# Patient Record
Sex: Male | Born: 1995 | Race: White | Hispanic: No
Health system: Southern US, Community
[De-identification: ages and names within clinical notes are randomized; demographics above are authoritative.]

---
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
## 2019-11-28 ENCOUNTER — Ambulatory Visit: Payer: 59 | Attending: Internal Medicine

## 2019-11-28 ENCOUNTER — Other Ambulatory Visit: Payer: Self-pay

## 2019-11-28 DIAGNOSIS — Z20822 Contact with and (suspected) exposure to covid-19: Secondary | ICD-10-CM | POA: Insufficient documentation

## 2019-11-29 LAB — NOVEL CORONAVIRUS, NAA: SARS-CoV-2, NAA: NOT DETECTED

## 2019-11-29 LAB — SARS-COV-2, NAA 2 DAY TAT

## 2020-04-17 ENCOUNTER — Other Ambulatory Visit: Payer: Self-pay

## 2020-04-17 DIAGNOSIS — Z20822 Contact with and (suspected) exposure to covid-19: Secondary | ICD-10-CM

## 2020-04-19 LAB — NOVEL CORONAVIRUS, NAA: SARS-CoV-2, NAA: NOT DETECTED

## 2020-04-19 LAB — SPECIMEN STATUS REPORT

## 2020-04-19 LAB — SARS-COV-2, NAA 2 DAY TAT

## 2020-05-02 ENCOUNTER — Ambulatory Visit
Admission: EM | Admit: 2020-05-02 | Discharge: 2020-05-02 | Disposition: A | Discharge status: Home / Self Care | Payer: 59 | Attending: Emergency Medicine | Admitting: Emergency Medicine

## 2020-05-02 ENCOUNTER — Other Ambulatory Visit: Payer: Self-pay

## 2020-05-02 DIAGNOSIS — R197 Diarrhea, unspecified: Secondary | ICD-10-CM | POA: Insufficient documentation

## 2020-05-02 DIAGNOSIS — R112 Nausea with vomiting, unspecified: Secondary | ICD-10-CM | POA: Diagnosis present

## 2020-05-02 MED ORDER — ONDANSETRON HCL 4 MG PO TABS
4.0000 mg | ORAL_TABLET | Freq: Four times a day (QID) | ORAL | 0 refills | Status: DC
Start: 2020-05-02 — End: 2024-06-13

## 2020-05-02 NOTE — ED Triage Notes (Signed)
Pt presents with c/o nausea, vomiting and diarrhea that has been intermittent for past 3 weeks, pt states yesterday was worse, appetite is good but gets full fast

## 2020-05-02 NOTE — ED Provider Notes (Signed)
[facility] CARE CENTER   [id number] 05/02/20 Arrival Time: 1501  CC: N/V/D  SUBJECTIVE:  Stephen Villegas is a 24 y.o. male who presents with complaint of intermittent episodes of nausea, vomiting, and diarrhea x 3 weeks.  Does not occur daily.  Most recent episode began last night.  Reports vomiting every hour since last night and have 3-4 watery BMs.  Denies a precipitating event, trauma, close contacts with similar symptoms, recent travel or antibiotic use.  Denies changes in diet or eating anything unusual.  Denies abdominal pain.  Has tried OTC pepto without relief.  Denies aggravating factors.  Denies similar symptoms in the past.  Reports chills, and decreased appetite.    Denies fever, weight changes, nausea, vomiting, chest pain, SOB, diarrhea, constipation, hematochezia, melena, dysuria, difficulty urinating, increased frequency or urgency, flank pain, loss of bowel or bladder function.  No LMP for male patient.  ROS: As per HPI.  All other pertinent ROS negative.     History reviewed. No pertinent past medical history. History reviewed. No pertinent surgical history. No Known Allergies No current facility-administered medications on file prior to encounter.   No current outpatient medications on file prior to encounter.   Social History   Socioeconomic History  . Marital status: Single    Spouse name: Not on file  . Number of children: Not on file  . Years of education: Not on file  . Highest education level: Not on file  Occupational History  . Not on file  Tobacco Use  . Smoking status: Never Smoker  . Smokeless tobacco: Never Used  Vaping Use  . Vaping Use: Every day  Substance and Sexual Activity  . Alcohol use: Not Currently  . Drug use: Yes    Types: Marijuana  . Sexual activity: Not on file  Other Topics Concern  . Not on file  Social History Narrative  . Not on file   Social Determinants of Health   Financial Resource Strain:   . Difficulty of Paying  Living Expenses: Not on file  Food Insecurity:   . Worried About Programme researcher, broadcasting/film/video in the Last Year: Not on file  . Ran Out of Food in the Last Year: Not on file  Transportation Needs:   . Lack of Transportation (Medical): Not on file  . Lack of Transportation (Non-Medical): Not on file  Physical Activity:   . Days of Exercise per Week: Not on file  . Minutes of Exercise per Session: Not on file  Stress:   . Feeling of Stress : Not on file  Social Connections:   . Frequency of Communication with Friends and Family: Not on file  . Frequency of Social Gatherings with Friends and Family: Not on file  . Attends Religious Services: Not on file  . Active Member of Clubs or Organizations: Not on file  . Attends Banker Meetings: Not on file  . Marital Status: Not on file  Intimate Partner Violence:   . Fear of Current or Ex-Partner: Not on file  . Emotionally Abused: Not on file  . Physically Abused: Not on file  . Sexually Abused: Not on file   History reviewed. No pertinent family history.   OBJECTIVE:  Vitals:   05/02/20 1526  BP: 126/71  Pulse: 79  Resp: 20  Temp: 98.4 F (36.9 C)  SpO2: 97%    General appearance: Alert; NAD HEENT: NCAT.  Oropharynx clear.  Lungs: clear to auscultation bilaterally without adventitious breath sounds Heart: regular  rate and rhythm.   Abdomen: soft, non-distended; normal active bowel sounds; non-tender to light and deep palpation; nontender at McBurney's point; no guarding Extremities: no edema; symmetrical with no gross deformities Skin: warm and dry Neurologic: normal gait Psychological: alert and cooperative; normal mood and affect   ASSESSMENT & PLAN:  1. Nausea vomiting and diarrhea     Meds ordered this encounter  Medications  . ondansetron (ZOFRAN) 4 MG tablet    Sig: Take 1 tablet (4 mg total) by mouth every 6 (six) hours.    Dispense:  12 tablet    Refill:  0    Order Specific Question:   Supervising  Provider    Answer:   Raylene Everts [[id number]]    Get rest and push fluids Avoid foods that do not agree with you Zofran prescribed for nausea Stool study kit given.  Please refrigerate at home and return ASAP Begin keeping a food diary to track what foods may be contributing to your symptoms Follow up with PCP or GI for further evaluation and management  If you experience new or worsening symptoms return or go to ER such as fever, chills, nausea, vomiting, diarrhea, bloody or dark tarry stools, constipation, urinary symptoms, abdominal discomfort, symptoms that do not improve with medications, inability to keep fluids down, etc...  Reviewed expectations re: course of current medical issues. Questions answered. Outlined signs and symptoms indicating need for more acute intervention. Patient verbalized understanding. After Visit Summary given.   Lestine Box, PA-C 05/02/20 1558

## 2020-05-02 NOTE — Discharge Instructions (Addendum)
Get rest and push fluids Avoid foods that do not agree with you Zofran prescribed for nausea Stool study kit given.  Please refrigerate at home and return ASAP Begin keeping a food diary to track what foods may be contributing to your symptoms Follow up with PCP or GI for further evaluation and management  If you experience new or worsening symptoms return or go to ER such as fever, chills, nausea, vomiting, diarrhea, bloody or dark tarry stools, constipation, urinary symptoms, abdominal discomfort, symptoms that do not improve with medications, inability to keep fluids down, etc...  Information on FODMAP diet attached

## 2020-05-05 ENCOUNTER — Ambulatory Visit
Admission: EM | Admit: 2020-05-05 | Discharge: 2020-05-05 | Disposition: A | Discharge status: Home / Self Care | Payer: 59 | Attending: Emergency Medicine | Admitting: Emergency Medicine

## 2020-05-05 DIAGNOSIS — Z1152 Encounter for screening for COVID-19: Secondary | ICD-10-CM | POA: Diagnosis not present

## 2020-05-05 DIAGNOSIS — R112 Nausea with vomiting, unspecified: Secondary | ICD-10-CM

## 2020-05-05 DIAGNOSIS — A084 Viral intestinal infection, unspecified: Secondary | ICD-10-CM

## 2020-05-05 LAB — GASTROINTESTINAL PANEL BY PCR, STOOL (REPLACES STOOL CULTURE)

## 2020-05-05 MED ORDER — ONDANSETRON 4 MG PO TBDP: 4.0000 mg | ORAL_TABLET | Freq: Three times a day (TID) | ORAL | 0 refills | Status: DC | PRN

## 2020-05-05 NOTE — Discharge Instructions (Addendum)

## 2020-05-05 NOTE — ED Provider Notes (Signed)
[facility] CARE CENTER   [id number] 05/05/20 Arrival Time: 1256  CC: ABDOMINAL DISCOMFORT  SUBJECTIVE:  Stephen Villegas is a 24 y.o. male who presented to the urgent care with a complaint o nausea, vomiting and diarrhea for the past few days.  Was seen at the urgent care on 05/02/2020.  Zofran was prescribed.  Gastrointestinal panel was ordered and we are still awaiting results.  Denies a precipitating event, trauma, close contacts with similar symptoms, recent travel or antibiotic use.  Denies abdominal pain.  Denies alleviating or aggravating factors.  Denies similar symptoms in the past.  Denies chills, fever, nausea, vomiting, diarrhea, constipation, hematochezia, melena, dysuria, difficulty urinating, increased frequency or urgency, flank pain, loss of bowel or bladder function.   No LMP for male patient.  ROS: As per HPI.  All other pertinent ROS negative.     History reviewed. No pertinent past medical history. History reviewed. No pertinent surgical history. No Known Allergies No current facility-administered medications on file prior to encounter.   Current Outpatient Medications on File Prior to Encounter  Medication Sig Dispense Refill  . ondansetron (ZOFRAN) 4 MG tablet Take 1 tablet (4 mg total) by mouth every 6 (six) hours. 12 tablet 0   Social History   Socioeconomic History  . Marital status: Single    Spouse name: Not on file  . Number of children: Not on file  . Years of education: Not on file  . Highest education level: Not on file  Occupational History  . Not on file  Tobacco Use  . Smoking status: Never Smoker  . Smokeless tobacco: Never Used  Vaping Use  . Vaping Use: Every day  Substance and Sexual Activity  . Alcohol use: Not Currently  . Drug use: Yes    Types: Marijuana  . Sexual activity: Not on file  Other Topics Concern  . Not on file  Social History Narrative  . Not on file   Social Determinants of Health   Financial Resource Strain:   .  Difficulty of Paying Living Expenses: Not on file  Food Insecurity:   . Worried About Programme researcher, broadcasting/film/video in the Last Year: Not on file  . Ran Out of Food in the Last Year: Not on file  Transportation Needs:   . Lack of Transportation (Medical): Not on file  . Lack of Transportation (Non-Medical): Not on file  Physical Activity:   . Days of Exercise per Week: Not on file  . Minutes of Exercise per Session: Not on file  Stress:   . Feeling of Stress : Not on file  Social Connections:   . Frequency of Communication with Friends and Family: Not on file  . Frequency of Social Gatherings with Friends and Family: Not on file  . Attends Religious Services: Not on file  . Active Member of Clubs or Organizations: Not on file  . Attends Banker Meetings: Not on file  . Marital Status: Not on file  Intimate Partner Violence:   . Fear of Current or Ex-Partner: Not on file  . Emotionally Abused: Not on file  . Physically Abused: Not on file  . Sexually Abused: Not on file   Family History  Problem Relation Age of Onset  . Healthy Mother   . Healthy Father      OBJECTIVE:  Vitals:   05/05/20 1304  BP: 129/81  Pulse: 68  Resp: 19  Temp: 98.4 F (36.9 C)  SpO2: 96%    General appearance:  Alert; NAD HEENT: NCAT.  Oropharynx clear.  Lungs: clear to auscultation bilaterally without adventitious breath sounds Heart: regular rate and rhythm.  Radial pulses 2+ symmetrical bilaterally Abdomen: soft, non-distended; normal active bowel sounds; non-tender to light and deep palpation; nontender at McBurney's point; negative Murphy's sign; negative rebound; no guarding Back: no CVA tenderness Extremities: no edema; symmetrical with no gross deformities Skin: warm and dry Neurologic: normal gait Psychological: alert and cooperative; normal mood and affect  LABS: No results found for this or any previous visit (from the past 24 hour(s)).  DIAGNOSTIC STUDIES: No results found.    ASSESSMENT & PLAN:  1. Non-intractable vomiting with nausea, unspecified vomiting type   2. Viral gastroenteritis   3. Encounter for screening for COVID-19     Meds ordered this encounter  Medications  . ondansetron (ZOFRAN ODT) 4 MG disintegrating tablet    Sig: Take 1 tablet (4 mg total) by mouth every 8 (eight) hours as needed for nausea or vomiting.    Dispense:  30 tablet    Refill:  0   Get rest and drink fluids Zofran prescribed.  Take as directed.    DIET Instructions:  30 minutes after taking nausea medicine, begin with sips of clear liquids. If able to hold down 2 - 4 ounces for 30 minutes, begin drinking more. Increase your fluid intake to replace losses. Clear liquids only for 24 hours (water, tea, sport drinks, clear flat ginger ale or cola and juices, broth, jello, popsicles, ect). Advance to bland foods, applesauce, rice, baked or boiled chicken, ect. Avoid milk, greasy foods and anything that doesn't agree with you.  If you experience new or worsening symptoms return or go to ER such as fever, chills, nausea, vomiting, diarrhea, bloody or dark tarry stools, constipation, urinary symptoms, worsening abdominal discomfort, symptoms that do not improve with medications, inability to keep fluids down, etc...    Reviewed expectations re: course of current medical issues. Questions answered. Outlined signs and symptoms indicating need for more acute intervention. Patient verbalized understanding. After Visit Summary given.   Durward Parcel, FNP 05/05/20 1319

## 2020-05-05 NOTE — ED Triage Notes (Signed)
Pt presents with complaints of ongoing nausea. Reports he was here on Friday and has not had any relief. Reports he has not been able to eat and drink like normal due to the feeling of wanting to throw up. Reports he has thrown up a couple of times but it was a small amount.

## 2020-05-07 LAB — NOVEL CORONAVIRUS, NAA: SARS-CoV-2, NAA: NOT DETECTED

## 2020-05-07 LAB — SARS-COV-2, NAA 2 DAY TAT

## 2020-11-11 ENCOUNTER — Other Ambulatory Visit: Payer: Self-pay

## 2020-11-11 ENCOUNTER — Other Ambulatory Visit (HOSPITAL_COMMUNITY): Payer: Self-pay | Admitting: Family Medicine

## 2020-11-11 ENCOUNTER — Ambulatory Visit (HOSPITAL_COMMUNITY)
Admission: RE | Admit: 2020-11-11 | Discharge: 2020-11-11 | Disposition: A | Discharge status: Home / Self Care | Payer: 59 | Source: Ambulatory Visit | Attending: Family Medicine | Admitting: Family Medicine

## 2020-11-11 DIAGNOSIS — R0782 Intercostal pain: Secondary | ICD-10-CM | POA: Diagnosis not present

## 2020-12-26 ENCOUNTER — Other Ambulatory Visit: Payer: Self-pay

## 2020-12-26 ENCOUNTER — Encounter (HOSPITAL_COMMUNITY): Payer: Self-pay | Admitting: *Deleted

## 2020-12-26 ENCOUNTER — Emergency Department (HOSPITAL_COMMUNITY): Payer: 59

## 2020-12-26 ENCOUNTER — Emergency Department (HOSPITAL_COMMUNITY)
Admission: EM | Admit: 2020-12-26 | Discharge: 2020-12-26 | Disposition: A | Discharge status: Home / Self Care | Payer: 59 | Attending: Emergency Medicine | Admitting: Emergency Medicine

## 2020-12-26 DIAGNOSIS — R110 Nausea: Secondary | ICD-10-CM | POA: Diagnosis not present

## 2020-12-26 DIAGNOSIS — K5900 Constipation, unspecified: Secondary | ICD-10-CM | POA: Diagnosis not present

## 2020-12-26 DIAGNOSIS — R1084 Generalized abdominal pain: Secondary | ICD-10-CM | POA: Insufficient documentation

## 2020-12-26 LAB — URINALYSIS, ROUTINE W REFLEX MICROSCOPIC
Bacteria, UA: NONE SEEN
Bilirubin Urine: NEGATIVE
Glucose, UA: 50 mg/dL — AB
Ketones, ur: 80 mg/dL — AB
Leukocytes,Ua: NEGATIVE
Nitrite: NEGATIVE
Protein, ur: 100 mg/dL — AB
Specific Gravity, Urine: 1.029 (ref 1.005–1.030)
pH: 6 (ref 5.0–8.0)

## 2020-12-26 LAB — COMPREHENSIVE METABOLIC PANEL
ALT: 19 U/L (ref 0–44)
AST: 22 U/L (ref 15–41)
Albumin: 5 g/dL (ref 3.5–5.0)
Alkaline Phosphatase: 60 U/L (ref 38–126)
Anion gap: 9 (ref 5–15)
BUN: 15 mg/dL (ref 6–20)
CO2: 30 mmol/L (ref 22–32)
Calcium: 9.5 mg/dL (ref 8.9–10.3)
Chloride: 98 mmol/L (ref 98–111)
Creatinine, Ser: 0.86 mg/dL (ref 0.61–1.24)
GFR, Estimated: >60 mL/min (ref >60)
Glucose, Bld: 126 mg/dL — ABNORMAL HIGH (ref 70–99)
Potassium: 3.8 mmol/L (ref 3.5–5.1)
Sodium: 137 mmol/L (ref 135–145)
Total Bilirubin: 1.1 mg/dL (ref 0.3–1.2)
Total Protein: 8.3 g/dL — ABNORMAL HIGH (ref 6.5–8.1)

## 2020-12-26 LAB — CBC WITH DIFFERENTIAL/PLATELET
Abs Immature Granulocytes: 0.05 10*3/uL (ref 0.00–0.07)
Basophils Absolute: 0.1 10*3/uL (ref 0.0–0.1)
Basophils Relative: 1 %
Eosinophils Absolute: 0 10*3/uL (ref 0.0–0.5)
Eosinophils Relative: 0 %
HCT: 49.7 % (ref 39.0–52.0)
Hemoglobin: 16.4 g/dL (ref 13.0–17.0)
Immature Granulocytes: 1 %
Lymphocytes Relative: 6 %
Lymphs Abs: 0.7 10*3/uL (ref 0.7–4.0)
MCH: 30.8 pg (ref 26.0–34.0)
MCHC: 33 g/dL (ref 30.0–36.0)
MCV: 93.4 fL (ref 80.0–100.0)
Monocytes Absolute: 0.3 10*3/uL (ref 0.1–1.0)
Monocytes Relative: 3 %
Neutro Abs: 9.7 10*3/uL — ABNORMAL HIGH (ref 1.7–7.7)
Neutrophils Relative %: 89 %
Platelets: 337 10*3/uL (ref 150–400)
RBC: 5.32 MIL/uL (ref 4.22–5.81)
RDW: 12.1 % (ref 11.5–15.5)
WBC: 10.8 10*3/uL — ABNORMAL HIGH (ref 4.0–10.5)
nRBC: 0 % (ref 0.0–0.2)

## 2020-12-26 LAB — LIPASE, BLOOD: Lipase: 24 U/L (ref 11–51)

## 2020-12-26 MED ORDER — OMEPRAZOLE 20 MG PO CPDR: 20.0000 mg | DELAYED_RELEASE_CAPSULE | Freq: Every day | ORAL | 0 refills | Status: DC

## 2020-12-26 MED ORDER — ONDANSETRON HCL 4 MG/2ML IJ SOLN
4.0000 mg | Freq: Once | INTRAMUSCULAR | Status: AC
  Administered 2020-12-26: 4 mg via INTRAVENOUS
  Filled 2020-12-26: qty 2

## 2020-12-26 NOTE — ED Triage Notes (Signed)
C/o abdominal pain with nausea and vomiting for months. States symptoms got worse today

## 2020-12-26 NOTE — Discharge Instructions (Signed)
Please pick up medication and take as prescribed. I have placed a referral to GI for you. They will call you to schedule an appointment. I would also recommend cutting back on marijuana as this may be causing your symptoms as well.   Drink plenty of fluids to stay hydrated.   Follow up with your PCP regarding ED visit.   Return for any new/worsening symptoms

## 2020-12-26 NOTE — ED Provider Notes (Signed)
Emergency Medicine Provider Triage Evaluation Note  Stephen Villegas , a 25 y.o. male  was evaluated in triage.  Pt complains of abdominal pain, n/v, constipation and weight loss over 4-5 months.  Seen at [facility], told was find, seen by pcp and placed on anxiety medicine.   Review of Systems  Positive: N/v/ constipation, abdominal pain, back pain Negative: Fevers, dysuria.    Physical Exam  BP (!) 116/104   Pulse (!) 59   Temp 98.2 F (36.8 C) (Oral)   Resp 18   Ht 5\' 10"  (1.778 m)   Wt 63.5 kg   SpO2 98%   BMI 20.09 kg/m  Gen:   Awake, no distress    Resp:  Normal effort   MSK:   Moves extremities without difficulty   Other:  Generalized abd pain, no guarding. Thin appearing male.  Medical Decision Making  Medically screening exam initiated at 5:59 PM.  Appropriate orders placed.  was informed that the remainder of the evaluation will be completed by another provider, this initial triage assessment does not replace that evaluation, and the importance of remaining in the ED until their evaluation is complete.  Medical screening exam complete.    Stephen Daniel, PA-C 12/26/20 12/28/20    Aldona Lento, MD 12/30/20 1226

## 2020-12-26 NOTE — ED Provider Notes (Signed)
MSE note.  Patient has been having some vomiting and loose stools off and on for a couple months.  Patient is stable and will have labs and x-rays done   Bethann Berkshire, MD 12/26/20 1743

## 2020-12-26 NOTE — ED Provider Notes (Signed)
[facility] EMERGENCY DEPARTMENT Provider Note   CSN: [id number] Arrival date & time: 12/26/20  1634     History Chief Complaint  Patient presents with  . Abdominal Pain    Stephen Villegas is a 25 y.o. male who presents to the ED today with complaint of gradual onset, intermittent, diffuse abdominal pain for the past 4-5 months. Pt also complains of nausea that presents after eating. He reports a 15 pound weight loss in the past 4-5 months. He also complains of intermittent constipation however last normal BM this morning. Has not taken anything for constipation. He reports he was seen at [facility] for same in the past with negative workup. He started seeing Dr. Margo Aye PCP recently for same and placed on Sertraline for depression. Pt does smoke marijuana heavily. No other complaints at this time. No previous abdominal surgeries. No hx of familial colon cancer.   The history is provided by the patient and medical records.       History reviewed. No pertinent past medical history.  There are no problems to display for this patient.   History reviewed. No pertinent surgical history.     Family History  Problem Relation Age of Onset  . Healthy Mother   . Healthy Father     Social History   Tobacco Use  . Smoking status: Never Smoker  . Smokeless tobacco: Never Used  Vaping Use  . Vaping Use: Every day  Substance Use Topics  . Alcohol use: Not Currently  . Drug use: Yes    Types: Marijuana    Home Medications Prior to Admission medications   Medication Sig Start Date End Date Taking? Authorizing Provider  omeprazole (PRILOSEC) 20 MG capsule Take 1 capsule (20 mg total) by mouth daily. 12/26/20 01/25/21 Yes Julliana Whitmyer, PA-C  ondansetron (ZOFRAN ODT) 4 MG disintegrating tablet Take 1 tablet (4 mg total) by mouth every 8 (eight) hours as needed for nausea or vomiting. 05/05/20   Avegno, Zachery Dakins, FNP  ondansetron (ZOFRAN) 4 MG tablet Take 1 tablet (4 mg total) by mouth every 6  (six) hours. 05/02/20   Wurst, [place], PA-C    Allergies    Patient has no known allergies.  Review of Systems   Review of Systems  Constitutional: Negative for chills and fever.  Gastrointestinal: Positive for abdominal pain, constipation and nausea.  Genitourinary: Negative for flank pain.  All other systems reviewed and are negative.   Physical Exam Updated Vital Signs BP (!) 116/104   Pulse (!) 59   Temp 98.2 F (36.8 C) (Oral)   Resp 18   Ht 5\' 10"  (1.778 m)   Wt 63.5 kg   SpO2 98%   BMI 20.09 kg/m   Physical Exam Vitals and nursing note reviewed.  Constitutional:      Appearance: He is not ill-appearing or diaphoretic.  HENT:     Head: Normocephalic and atraumatic.  Eyes:     Conjunctiva/sclera: Conjunctivae normal.  Cardiovascular:     Rate and Rhythm: Normal rate and regular rhythm.     Heart sounds: Normal heart sounds.  Pulmonary:     Effort: Pulmonary effort is normal.     Breath sounds: Normal breath sounds. No wheezing, rhonchi or rales.  Abdominal:     Palpations: Abdomen is soft.     Tenderness: There is no abdominal tenderness. There is no right CVA tenderness, left CVA tenderness, guarding or rebound.  Musculoskeletal:     Cervical back: Neck supple.  Skin:  General: Skin is warm and dry.  Neurological:     Mental Status: He is alert.     ED Results / Procedures / Treatments   Labs (all labs ordered are listed, but only abnormal results are displayed) Labs Reviewed  CBC WITH DIFFERENTIAL/PLATELET - Abnormal; Notable for the following components:      Result Value   WBC 10.8 (*)    Neutro Abs 9.7 (*)    All other components within normal limits  COMPREHENSIVE METABOLIC PANEL - Abnormal; Notable for the following components:   Glucose, Bld 126 (*)    Total Protein 8.3 (*)    All other components within normal limits  URINALYSIS, ROUTINE W REFLEX MICROSCOPIC - Abnormal; Notable for the following components:   APPearance HAZY (*)     Glucose, UA 50 (*)    Hgb urine dipstick SMALL (*)    Ketones, ur 80 (*)    Protein, ur 100 (*)    All other components within normal limits  LIPASE, BLOOD    EKG None  Radiology DG ABD ACUTE 2+V W 1V CHEST  Result Date: 12/26/2020 CLINICAL DATA:  25 year old male with abdominal pain, nausea vomiting. EXAM: DG ABDOMEN ACUTE WITH 1 VIEW CHEST COMPARISON:  Chest radiograph dated 11/11/2020. FINDINGS: There is no evidence of dilated bowel loops or free intraperitoneal air. No radiopaque calculi or other significant radiographic abnormality is seen. Heart size and mediastinal contours are within normal limits. Both lungs are clear. IMPRESSION: Negative abdominal radiographs.  No acute cardiopulmonary disease. Electronically Signed   By: Elgie Collard M.D.   On: 12/26/2020 18:41    Procedures Procedures   Medications Ordered in ED Medications  ondansetron (ZOFRAN) injection 4 mg (4 mg Intravenous Given 12/26/20 1819)    ED Course  I have reviewed the triage vital signs and the nursing notes.  Pertinent labs & imaging results that were available during my care of the patient were reviewed by me and considered in my medical decision making (see chart for details).    MDM Rules/Calculators/A&P                          25 year old male who presents to the ED today with ongoing complaints of diffuse abdominal pain, nausea that presents after eating anything, constipation.  Also had a weight loss.  Recently prescribed antidepression medication by PCP related to his symptoms.  On arrival to the ED today vitals are stable.  Patient appears to be no acute distress.  He was medically screened and work-up started with labs as well as abdominal series to rule out constipation. Xray negative at this time. CBC with mild leukocytosis 10.8. No left shift. CMP with glucose 126. No other electrolyte abnormalities. Lipase 24. U/a with small hgb and 6-10 RBC; no flank TTP on exam and given symptoms have  been ongoing for several months low suspicion for kidney stone. No signs of infection.  Workup reassuring at this time. Pt does smoke marijuana heavily; question CHS however given nausea with eating question PUD as well. I do feel pt will need GI follow up and PPI. Pt currently requesting something to drink. Will plan to fluid challenge and if able to tolerate will discharge home. I have very low suspicion for acute abdomen at this time and do not feel pt requires further work up.   Able to tolerate fluids without difficulty. Stable for discharge home at this time.   This note was prepared  using Conservation officer, historic buildings and may include unintentional dictation errors due to the inherent limitations of voice recognition software.  Final Clinical Impression(s) / ED Diagnoses Final diagnoses:  Generalized abdominal pain  Nausea    Rx / DC Orders ED Discharge Orders         Ordered    Ambulatory referral to Gastroenterology        12/26/20 2037    omeprazole (PRILOSEC) 20 MG capsule  Daily        12/26/20 2037           Discharge Instructions     Please pick up medication and take as prescribed. I have placed a referral to GI for you. They will call you to schedule an appointment. I would also recommend cutting back on marijuana as this may be causing your symptoms as well.   Drink plenty of fluids to stay hydrated.   Follow up with your PCP regarding ED visit.   Return for any new/worsening symptoms       Tanda Rockers, Cordelia Poche 12/26/20 2039    Bethann Berkshire, MD 12/30/20 1226

## 2020-12-26 NOTE — ED Notes (Signed)
Pt tolerated PO challenge without complication

## 2020-12-26 NOTE — ED Triage Notes (Signed)
Also c/o constipation.

## 2020-12-31 ENCOUNTER — Other Ambulatory Visit (HOSPITAL_COMMUNITY): Payer: Self-pay | Admitting: Student

## 2020-12-31 ENCOUNTER — Other Ambulatory Visit: Payer: Self-pay

## 2020-12-31 ENCOUNTER — Ambulatory Visit (HOSPITAL_COMMUNITY)
Admission: RE | Admit: 2020-12-31 | Discharge: 2020-12-31 | Disposition: A | Discharge status: Home / Self Care | Payer: 59 | Source: Ambulatory Visit | Attending: Student | Admitting: Student

## 2020-12-31 ENCOUNTER — Other Ambulatory Visit: Payer: Self-pay | Admitting: Student

## 2020-12-31 DIAGNOSIS — R1013 Epigastric pain: Secondary | ICD-10-CM | POA: Insufficient documentation

## 2021-01-05 ENCOUNTER — Encounter: Payer: Self-pay | Admitting: *Deleted

## 2021-02-11 ENCOUNTER — Encounter: Payer: Self-pay | Admitting: Internal Medicine

## 2021-02-11 ENCOUNTER — Ambulatory Visit: Payer: 59 | Admitting: Internal Medicine

## 2022-02-03 IMAGING — DX DG ABDOMEN ACUTE W/ 1V CHEST
3 series · 3 of 3 positions shown · non-contrast
Comparison: Chest radiograph dated 11/11/2020.

CLINICAL DATA: 24-year-old male with abdominal pain, nausea
vomiting.

EXAM:
DG ABDOMEN ACUTE WITH 1 VIEW CHEST

[chest pa]
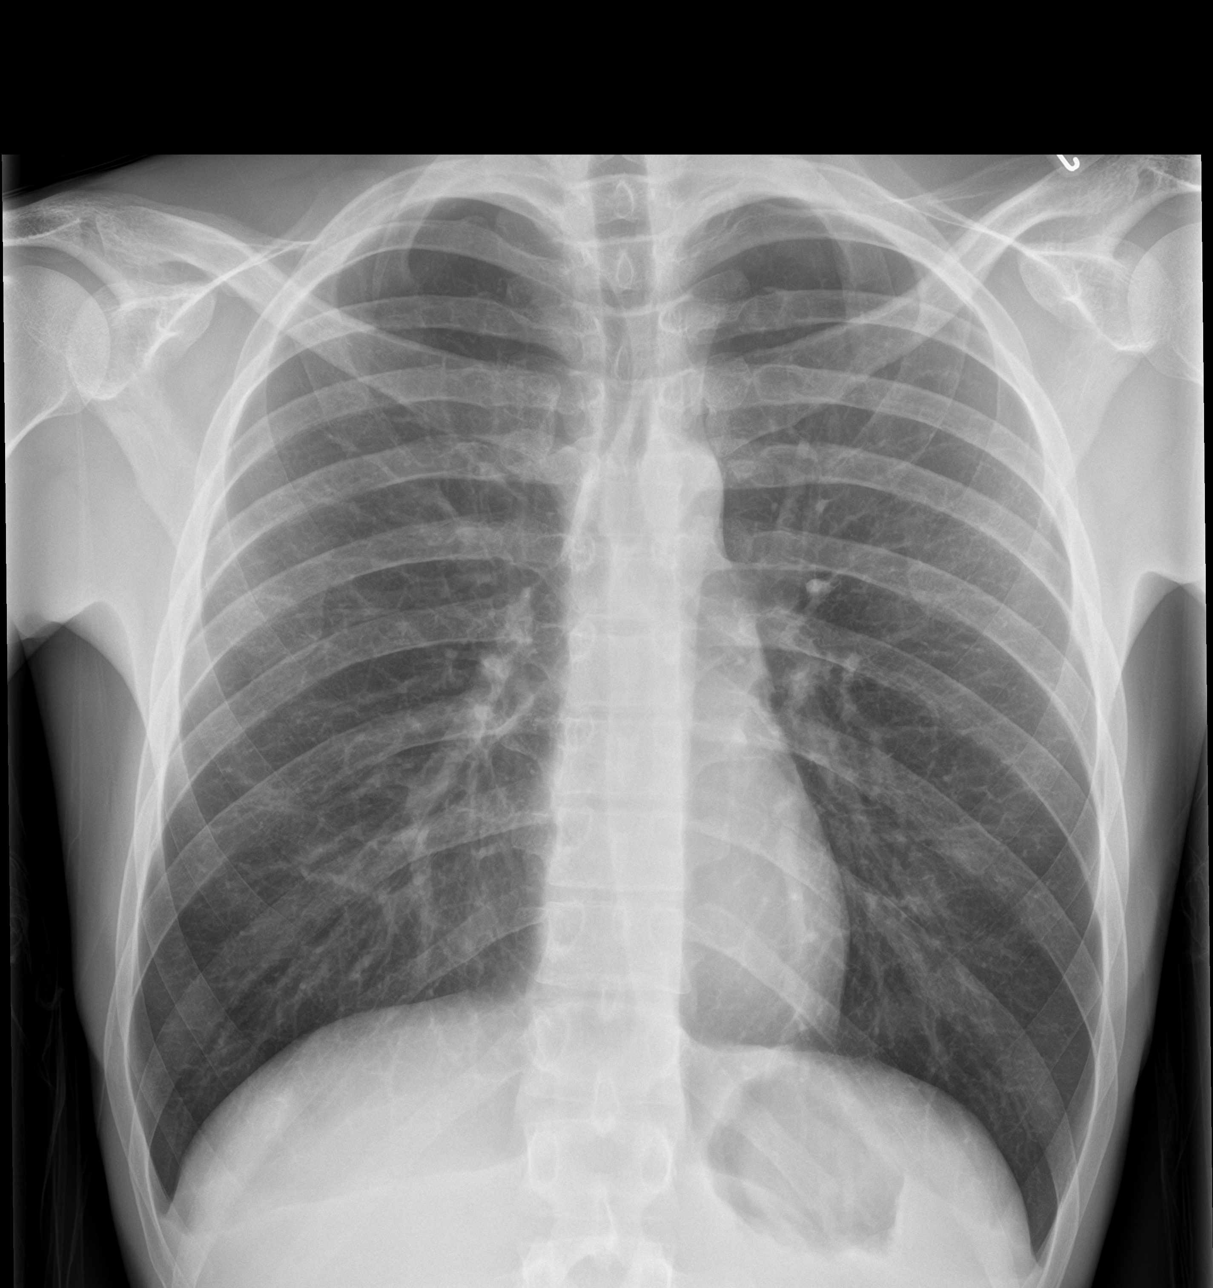

[abdomen erect]
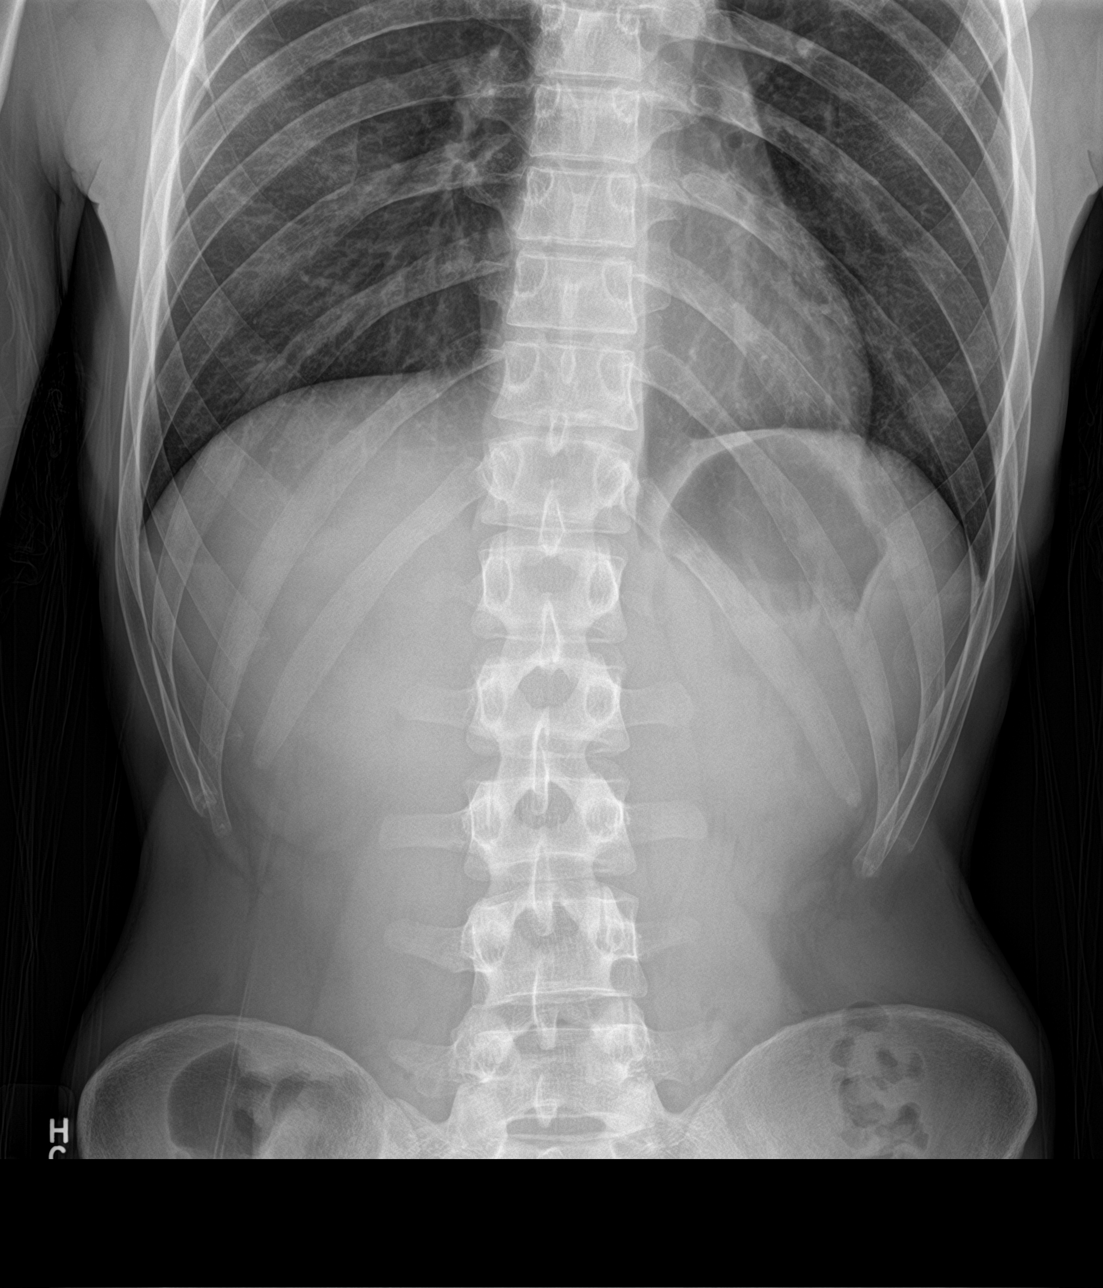

[abdomen supine]
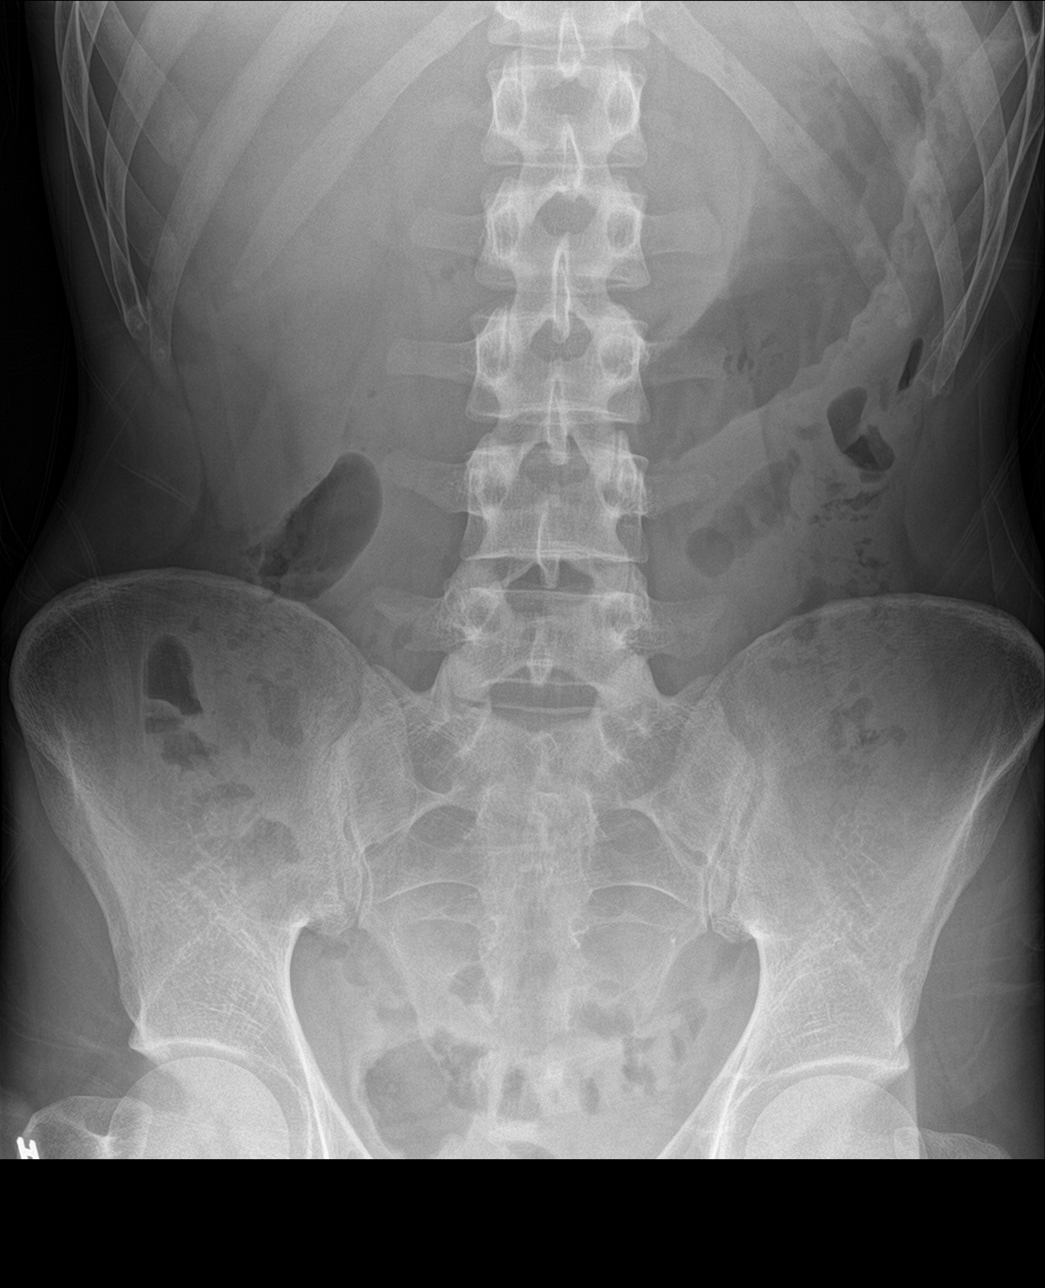

[3 of 3 positions shown; findings below may reference images not displayed]

FINDINGS: There is no evidence of dilated bowel loops or free intraperitoneal
air. No radiopaque calculi or other significant radiographic
abnormality is seen. Heart size and mediastinal contours are within
normal limits. Both lungs are clear.
IMPRESSION: Negative abdominal radiographs.  No acute cardiopulmonary disease.

## 2023-11-03 ENCOUNTER — Other Ambulatory Visit (HOSPITAL_COMMUNITY): Payer: Self-pay | Admitting: Nurse Practitioner

## 2023-11-03 ENCOUNTER — Ambulatory Visit (HOSPITAL_COMMUNITY)
Admission: RE | Admit: 2023-11-03 | Discharge: 2023-11-03 | Disposition: A | Discharge status: Home / Self Care | Source: Ambulatory Visit | Attending: Nurse Practitioner | Admitting: Nurse Practitioner

## 2023-11-03 DIAGNOSIS — R6881 Early satiety: Secondary | ICD-10-CM

## 2023-11-07 ENCOUNTER — Other Ambulatory Visit (HOSPITAL_COMMUNITY): Payer: Self-pay | Admitting: Nurse Practitioner

## 2023-11-07 DIAGNOSIS — R6881 Early satiety: Secondary | ICD-10-CM

## 2023-11-12 ENCOUNTER — Ambulatory Visit (HOSPITAL_COMMUNITY): Admission: RE | Admit: 2023-11-12 | Source: Ambulatory Visit

## 2023-11-12 ENCOUNTER — Encounter (HOSPITAL_COMMUNITY): Payer: Self-pay

## 2023-11-12 NOTE — Progress Notes (Deleted)
 Referring Provider: Omie Bickers, MD Primary Care Physician:  Omie Bickers, MD Primary Gastroenterologist:  Dr. Jenney Modest chief complaint on file.   HPI:   Stephen Villegas is a 28 y.o. male presenting today at the request of Omie Bickers, MD for early satiety.   Abdominal x ray 11/03/23:No abnormal bowel dilation. Minimal stool in descending colon.   Labs 11/03/23: CBC, Cmp wnl.   CT A/P with contrast 11/12/23:***   Today:    No past medical history on file.  No past surgical history on file.  Current Outpatient Medications  Medication Sig Dispense Refill   omeprazole (PRILOSEC) 20 MG capsule Take 1 capsule (20 mg total) by mouth daily. 30 capsule 0   ondansetron (ZOFRAN ODT) 4 MG disintegrating tablet Take 1 tablet (4 mg total) by mouth every 8 (eight) hours as needed for nausea or vomiting. 30 tablet 0   ondansetron (ZOFRAN) 4 MG tablet Take 1 tablet (4 mg total) by mouth every 6 (six) hours. 12 tablet 0   No current facility-administered medications for this visit.    Allergies as of 11/14/2023   (No Known Allergies)    Family History  Problem Relation Age of Onset   Healthy Mother    Healthy Father     Social History   Socioeconomic History   Marital status: Married    Spouse name: Not on file   Number of children: Not on file   Years of education: Not on file   Highest education level: Not on file  Occupational History   Not on file  Tobacco Use   Smoking status: Never   Smokeless tobacco: Never  Vaping Use   Vaping status: Every Day  Substance and Sexual Activity   Alcohol use: Not Currently   Drug use: Yes    Types: Marijuana   Sexual activity: Not on file  Other Topics Concern   Not on file  Social History Narrative   ** Merged History Encounter **       Social Drivers of Health   Financial Resource Strain: Not on file  Food Insecurity: Not on file  Transportation Needs: Not on file  Physical Activity: Not on file  Stress: Not on file   Social Connections: Not on file  Intimate Partner Violence: Not on file    Review of Systems: Gen: Denies any fever, chills, fatigue, weight loss, lack of appetite.  CV: Denies chest pain, heart palpitations, peripheral edema, syncope.  Resp: Denies shortness of breath at rest or with exertion. Denies wheezing or cough.  GI: Denies dysphagia or odynophagia. Denies jaundice, hematemesis, fecal incontinence. GU : Denies urinary burning, urinary frequency, urinary hesitancy MS: Denies joint pain, muscle weakness, cramps, or limitation of movement.  Derm: Denies rash, itching, dry skin Psych: Denies depression, anxiety, memory loss, and confusion Heme: Denies bruising, bleeding, and enlarged lymph nodes.  Physical Exam: There were no vitals taken for this visit. General:   Alert and oriented. Pleasant and cooperative. Well-nourished and well-developed.  Head:  Normocephalic and atraumatic. Eyes:  Without icterus, sclera clear and conjunctiva pink.  Ears:  Normal auditory acuity. Lungs:  Clear to auscultation bilaterally. No wheezes, rales, or rhonchi. No distress.  Heart:  S1, S2 present without murmurs appreciated.  Abdomen:  +BS, soft, non-tender and non-distended. No HSM noted. No guarding or rebound. No masses appreciated.  Rectal:  Deferred  Msk:  Symmetrical without gross deformities. Normal posture. Extremities:  Without edema. Neurologic:  Alert  and  oriented x4;  grossly normal neurologically. Skin:  Intact without significant lesions or rashes. Psych:  Alert and cooperative. Normal mood and affect.    Assessment:     Plan:  ***   Shana Daring, PA-C [facility] 11/14/2023

## 2023-11-14 ENCOUNTER — Ambulatory Visit: Admitting: Gastroenterology

## 2023-11-15 ENCOUNTER — Encounter (INDEPENDENT_AMBULATORY_CARE_PROVIDER_SITE_OTHER): Payer: Self-pay | Admitting: *Deleted

## 2023-11-22 NOTE — Progress Notes (Deleted)
 Referring Provider:*** Primary Care Physician:  Omie Bickers, MD Primary Gastroenterologist:  Dr. Jenney Modest chief complaint on file.   HPI:   Stephen Villegas is a 28 y.o. male presenting today at the request of Omie Bickers, MD for early satiety.    Per referral: Abdominal x ray 11/03/23:No abnormal bowel dilation. Minimal stool in descending colon.    Labs 11/03/23: CBC, Cmp wnl.    No showed to CT A/P with contrast 11/12/23     Today:      No past medical history on file.  No past surgical history on file.  Current Outpatient Medications  Medication Sig Dispense Refill   omeprazole  (PRILOSEC) 20 MG capsule Take 1 capsule (20 mg total) by mouth daily. 30 capsule 0   ondansetron  (ZOFRAN  ODT) 4 MG disintegrating tablet Take 1 tablet (4 mg total) by mouth every 8 (eight) hours as needed for nausea or vomiting. 30 tablet 0   ondansetron  (ZOFRAN ) 4 MG tablet Take 1 tablet (4 mg total) by mouth every 6 (six) hours. 12 tablet 0   No current facility-administered medications for this visit.    Allergies as of 11/24/2023   (No Known Allergies)    Family History  Problem Relation Age of Onset   Healthy Mother    Healthy Father     Social History   Socioeconomic History   Marital status: Married    Spouse name: Not on file   Number of children: Not on file   Years of education: Not on file   Highest education level: Not on file  Occupational History   Not on file  Tobacco Use   Smoking status: Never   Smokeless tobacco: Never  Vaping Use   Vaping status: Every Day  Substance and Sexual Activity   Alcohol use: Not Currently   Drug use: Yes    Types: Marijuana   Sexual activity: Not on file  Other Topics Concern   Not on file  Social History Narrative   ** Merged History Encounter **       Social Drivers of Health   Financial Resource Strain: Not on file  Food Insecurity: Not on file  Transportation Needs: Not on file  Physical Activity: Not on file   Stress: Not on file  Social Connections: Not on file  Intimate Partner Violence: Not on file    Review of Systems: Gen: Denies any fever, chills, fatigue, weight loss, lack of appetite.  CV: Denies chest pain, heart palpitations, peripheral edema, syncope.  Resp: Denies shortness of breath at rest or with exertion. Denies wheezing or cough.  GI: Denies dysphagia or odynophagia. Denies jaundice, hematemesis, fecal incontinence. GU : Denies urinary burning, urinary frequency, urinary hesitancy MS: Denies joint pain, muscle weakness, cramps, or limitation of movement.  Derm: Denies rash, itching, dry skin Psych: Denies depression, anxiety, memory loss, and confusion Heme: Denies bruising, bleeding, and enlarged lymph nodes.  Physical Exam: There were no vitals taken for this visit. General:   Alert and oriented. Pleasant and cooperative. Well-nourished and well-developed.  Head:  Normocephalic and atraumatic. Eyes:  Without icterus, sclera clear and conjunctiva pink.  Ears:  Normal auditory acuity. Lungs:  Clear to auscultation bilaterally. No wheezes, rales, or rhonchi. No distress.  Heart:  S1, S2 present without murmurs appreciated.  Abdomen:  +BS, soft, non-tender and non-distended. No HSM noted. No guarding or rebound. No masses appreciated.  Rectal:  Deferred  Msk:  Symmetrical without gross deformities. Normal  posture. Extremities:  Without edema. Neurologic:  Alert and  oriented x4;  grossly normal neurologically. Skin:  Intact without significant lesions or rashes. Psych:  Alert and cooperative. Normal mood and affect.    Assessment:     Plan:  ***   Shana Daring, PA-C [facility] 11/24/2023

## 2023-11-24 ENCOUNTER — Ambulatory Visit: Admitting: Gastroenterology

## 2024-06-02 ENCOUNTER — Other Ambulatory Visit: Payer: Self-pay

## 2024-06-02 ENCOUNTER — Encounter (HOSPITAL_COMMUNITY): Payer: Self-pay

## 2024-06-02 ENCOUNTER — Emergency Department (HOSPITAL_COMMUNITY)
Admission: EM | Admit: 2024-06-02 | Discharge: 2024-06-02 | Disposition: A | Discharge status: Home / Self Care | Attending: Emergency Medicine | Admitting: Emergency Medicine

## 2024-06-02 DIAGNOSIS — F13930 Sedative, hypnotic or anxiolytic use, unspecified with withdrawal, uncomplicated: Secondary | ICD-10-CM

## 2024-06-02 DIAGNOSIS — F13239 Sedative, hypnotic or anxiolytic dependence with withdrawal, unspecified: Secondary | ICD-10-CM | POA: Diagnosis present

## 2024-06-02 MED ORDER — ALPRAZOLAM 1 MG PO TABS: 1.0000 mg | ORAL_TABLET | Freq: Three times a day (TID) | ORAL | 0 refills | Status: DC

## 2024-06-02 NOTE — ED Notes (Signed)
 Pt report last time of taking xanax was 2 days ago.

## 2024-06-02 NOTE — ED Triage Notes (Signed)
 Pt states he has been buying xanax for anxiety for years. Pt wants detox from the xanax. Pt states he does not want rehab but wants an alternative for his anxiety. Pt denies any withdrawal symptoms at this time.

## 2024-06-02 NOTE — Discharge Instructions (Addendum)
 You were seen in the emergency room for withdrawals from Xanax. As discussed, these medications cause dependence and also potentially addiction, and need to be prescribed by your physician.  Withdrawals can also be severe, and individuals will need to be tapered off of the medication with medical supervision.  In your situation, we are prescribing you Xanax for 3 days.  On Monday, It is prudent that you go to behavioral health urgent care walk-in clinic For medication management.. They are open Mon-Fri for open access walk-in hours. Please arrive by 7:00 AM on the second floor. For therapy Mon, Wed, and Thurs you must arrive by 7:15 AM. All appointments are on first come, first served basis. [address] [place], [place] [address] [phone]   Benzo withdrawals can be pretty severe.  Read the instructions provided. Return to the ER if your symptoms get worse.

## 2024-06-02 NOTE — ED Provider Notes (Signed)
 Stephen Villegas Dr Stephen Villegas Provider Note   CSN: [id number] Arrival date & time: 06/02/24  9049     Patient presents with: Detox   Stephen Villegas is a 28 y.o. male.   HPI    28 year old male comes in with chief complaint of benzo withdrawal.  Patient states that he has been getting Xanax purchased on the street for the last several months.  He thinks he has been on Xanax since 2022.  He ran out of his Xanax 2 days ago.  Now he is having some shaking, difficulty with sleeping and restlessness.  He is requesting help with his benzo use disorder.  He does not think he has benzo addiction, but states that his anxiety needs to be properly diagnosed and managed.  Once he gets to that state, he would like actually to be taken off of benzodiazepine if possible.  Patient here with his son.  He states that he is motivated to be less reliant on benzodiazepine.  He wants to go home at this time, but is requesting help for his withdrawal symptoms.  Last time he did not use Xanax for extended period of time was 6 months ago, when he was off of it for close to 10 days.  At that time he was having severe nausea and vomiting.  With him having his son, he just does not want his symptoms to get that bad.  Prior to Admission medications   Medication Sig Start Date End Date Taking? Authorizing Provider  ALPRAZolam (XANAX) 1 MG tablet Take 1 tablet (1 mg total) by mouth 3 (three) times daily. 06/02/24  Yes Charlyn Sora, MD  omeprazole  (PRILOSEC) 20 MG capsule Take 1 capsule (20 mg total) by mouth daily. 12/26/20 01/25/21  Shepard Clinch, PA-C  ondansetron  (ZOFRAN  ODT) 4 MG disintegrating tablet Take 1 tablet (4 mg total) by mouth every 8 (eight) hours as needed for nausea or vomiting. 05/05/20   Avegno, Komlanvi S, FNP  ondansetron  (ZOFRAN ) 4 MG tablet Take 1 tablet (4 mg total) by mouth every 6 (six) hours. 05/02/20   Wurst, Brittany, PA-C    Allergies: Patient has no known  allergies.    Review of Systems  All other systems reviewed and are negative.   Updated Vital Signs BP (!) 101/56   Pulse 95   Temp 98.3 F (36.8 C) (Oral)   Resp 18   Ht 6' (1.829 m)   Wt 68 kg   SpO2 96%   BMI 20.34 kg/m   Physical Exam Vitals and nursing note reviewed.  Constitutional:      Appearance: He is well-developed.  HENT:     Head: Atraumatic.  Cardiovascular:     Rate and Rhythm: Normal rate.  Pulmonary:     Effort: Pulmonary effort is normal.  Musculoskeletal:     Cervical back: Neck supple.  Skin:    General: Skin is warm.  Neurological:     Mental Status: He is alert and oriented to person, place, and time.     Comments: Mild tremor noted  Psychiatric:        Mood and Affect: Mood normal.        Behavior: Behavior normal.     (all labs ordered are listed, but only abnormal results are displayed) Labs Reviewed - No data to display  EKG: None  Radiology: No results found.   Procedures   Medications Ordered in the ED - No data to display  Medical Decision Making Risk Prescription drug management.   28 year old male comes in with chief complaint of insomnia, restlessness, tremors.  He appears to have benzo dependence.  He has been buying Xanax on the street.  Typically he takes 2 mg 2 or 3 times a day.  Last use of Xanax was 2 days ago.  Currently he is having mild to moderate benzo withdrawal symptoms, with no autonomic dysfunction, hallucinations or confusion.  Patient does not want to go to detox.  He is here with his son, he just needs his symptoms to be better controlled so that he can take care of his son.  I discussed the case with the psychiatry team.  Our plan is for patient to go to behavioral health urgent care on Monday for the walk-in clinic.  He will be given 3 days worth of 1 mg Xanax 3 times daily until then.  Patient aware that ED will not be prescribing him additional Xanax, and patient  understanding of that.  He states that he has been trying to get in touch with Dr. Shona, his PCP as well.  I encouraged that he goes to behavioral health urgent care where they can help with taper, rehab if needed or at least optimize his diagnoses and start him on medications properly.   Final diagnoses:  Benzodiazepine withdrawal without complication [facility])    ED Discharge Orders          Ordered    ALPRAZolam (XANAX) 1 MG tablet  3 times daily        06/02/24 1048               Charlyn Sora, MD 06/02/24 1408

## 2024-06-04 ENCOUNTER — Encounter (HOSPITAL_COMMUNITY): Payer: Self-pay | Admitting: Psychiatry

## 2024-06-04 ENCOUNTER — Ambulatory Visit (INDEPENDENT_AMBULATORY_CARE_PROVIDER_SITE_OTHER): Admitting: Psychiatry

## 2024-06-04 VITALS — BP 123/68 | HR 82 | Temp 98.0°F | Ht 70.0 in | Wt 153.6 lb

## 2024-06-04 DIAGNOSIS — F1290 Cannabis use, unspecified, uncomplicated: Secondary | ICD-10-CM

## 2024-06-04 DIAGNOSIS — F1994 Other psychoactive substance use, unspecified with psychoactive substance-induced mood disorder: Secondary | ICD-10-CM | POA: Diagnosis not present

## 2024-06-04 DIAGNOSIS — F1320 Sedative, hypnotic or anxiolytic dependence, uncomplicated: Secondary | ICD-10-CM | POA: Diagnosis not present

## 2024-06-04 DIAGNOSIS — F411 Generalized anxiety disorder: Secondary | ICD-10-CM | POA: Diagnosis not present

## 2024-06-04 MED ORDER — PROPRANOLOL HCL 10 MG PO TABS: 10.0000 mg | ORAL_TABLET | Freq: Three times a day (TID) | ORAL | 3 refills | Status: AC

## 2024-06-04 MED ORDER — ALPRAZOLAM 0.5 MG PO TABS: 1.0000 mg | ORAL_TABLET | Freq: Two times a day (BID) | ORAL | 0 refills | Status: AC | PRN

## 2024-06-04 MED ORDER — MIRTAZAPINE 15 MG PO TABS: 15.0000 mg | ORAL_TABLET | Freq: Every day | ORAL | 3 refills | Status: AC

## 2024-06-04 NOTE — Progress Notes (Signed)
 Psychiatric Initial Adult Assessment   Patient Identification: Stephen Villegas MRN:  [id number] Date of Evaluation:  06/04/2024 Referral Source: Walk-in Chief Complaint:   I want to get off of Xanax Visit Diagnosis:    ICD-10-CM   1. 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 Substance induced mood disorder (HCC)  F19.94     3. Marijuana use  F12.90       History of Present Illness: 28 year old male seen today for initial psychiatric evaluation.  Patient was [facility] emergency department (06/02/2024) after experience benzo withdrawal.  Per chart review patient was experiencing nausea vomiting, and poor sleep.  He was given Xanax 1 mg 3 times daily.  Patient reports that he ran out of this prescription yesterday and is fearful that he will have a seizure.  He notes that he is currently managed on Wellbutrin 150 mg daily but finds it ineffective.  Patient has a psychiatric history of substance use (Xanax, alcohol, tobacco, marijuana) and depression.  Today he is well-groomed, pleasant, cooperative, and engaged in conversation.  Patient speech is a somewhat rapid.  He informed clinical research associate that he is anxious and is fearful that he will have a seizure.  He notes that he fears that this will occur when he is in the car with his 12-year-old son.  He reports that he has been on Xanax since 2022 and reports since starting, it has he been difficult stopping it.  Provider recommended patient stay at urgent care and receiving detox however he was not agreeable.  Patient notes that he is fearful that he will be able to maintain his job if hospitalized.  He informed clinical research associate that he has provide for his 50-year-old son and his girlfriend.  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  Patient reports that the above exacerbates his anxiety.  Today provider conducted a GAD-7 and patient scored a 17.  Provider also conducted PHQ-9 and patient scored a 4.  Patient  reports that his sleep has been poor noting that he sleeps approximately 3 to 4 hours.  He also reports that his appetite has been decreased.  He informed that he would like to gain weight.  Today he denies SI/HI/AVH or paranoia.  Patient informed writer that to cope he smokes marijuana.  He also notes that he drinks alcohol.  Provider conducted an audit assessment and patient scored a 14.  Provider informed patient that alcohol and marijuana can exacerbate his symptoms.  He endorsed understanding and notes that he has been trying to cut back.  He does note that he is irritable, distracted, and has fluctuations in mood.  He also notes that he impulsively gambles.  Patient informed writer that he took antidepressants such as Celexa and Cymbalta in the past.  He notes that it caused sexual side effects.  He also notes that Wellbutrin makes him irritable.  Today he is agreeable to starting mirtazapine 15 mg nightly, and sleep, anxiety, depression.  He is also agreeable to starting propranolol 10 mg 3 times daily to help manage anxiety.  Xanax was reduced from 1 mg 3 times daily to 0.5 mg twice daily.  Provider instructed patient to take 2 tablets daily as needed for a week and then reduce to 1 tablet daily as needed and then discontinue.  At this time provider recommended not to continue Wellbutrin.  Patient will follow-up with psychiatric services in [place].  No other  concerns at this time.  Associated Signs/Symptoms: Depression Symptoms:  insomnia, feelings of worthlessness/guilt, difficulty concentrating, anxiety, (Hypo) Manic Symptoms:  Distractibility, Elevated Mood, Flight of Ideas, Impulsivity,Gambling Anxiety Symptoms:  Excessive Worry, Psychotic Symptoms:  Denies PTSD Symptoms: Had a traumatic exposure:  Denies  Past Psychiatric History: Marijuana use, Xanax misuse, depression  Previous Psychotropic Medications: Wellbutrin (made agitated), Celexa (sexual side effects), Cymbalta  (sexual side effects), hydroxyzine disliked  Substance Abuse History in the last 12 months:  Yes.  Xanax misuse, marijuana, alcohol  Consequences of Substance Abuse: NA  Past Medical History: History reviewed. No pertinent past medical history. History reviewed. No pertinent surgical history.  Family Psychiatric History: Father gambling addiction  Family History:  Family History  Problem Relation Age of Onset   Healthy Mother    Healthy Father     Social History:   Social History   Socioeconomic History   Marital status: Married    Spouse name: Not on file   Number of children: Not on file   Years of education: Not on file   Highest education level: Not on file  Occupational History   Not on file  Tobacco Use   Smoking status: Every Day    Types: E-cigarettes   Smokeless tobacco: Never  Vaping Use   Vaping status: Every Day  Substance and Sexual Activity   Alcohol use: Yes    Comment: occasionally   Drug use: Yes    Types: Marijuana    Comment: xanax   Sexual activity: Yes  Other Topics Concern   Not on file  Social History Narrative   ** Merged History Encounter **       Social Drivers of Corporate Investment Banker Strain: Not on file  Food Insecurity: Not on file  Transportation Needs: Not on file  Physical Activity: Not on file  Stress: Not on file  Social Connections: Not on file    Additional Social History: Patient resides in [place] with his 23-year-old son and girlfriend.  He works in nurse, children's.  He endorses alcohol, marijuana, and Xanax use.  He also notes that he vapes tobacco. Allergies:  No Known Allergies  Metabolic Disorder Labs: No results found for: HGBA1C, MPG No results found for: PROLACTIN No results found for: CHOL, TRIG, HDL, CHOLHDL, VLDL, LDLCALC No results found for: TSH  Therapeutic Level Labs: No results found for: LITHIUM No results found for: CBMZ No results found for:  VALPROATE  Current Medications: Current Outpatient Medications  Medication Sig Dispense Refill   ALPRAZolam (XANAX) 1 MG tablet Take 1 tablet (1 mg total) by mouth 3 (three) times daily. 10 tablet 0   omeprazole  (PRILOSEC) 20 MG capsule Take 1 capsule (20 mg total) by mouth daily. 30 capsule 0   ondansetron  (ZOFRAN  ODT) 4 MG disintegrating tablet Take 1 tablet (4 mg total) by mouth every 8 (eight) hours as needed for nausea or vomiting. 30 tablet 0   ondansetron  (ZOFRAN ) 4 MG tablet Take 1 tablet (4 mg total) by mouth every 6 (six) hours. 12 tablet 0   No current facility-administered medications for this visit.    Musculoskeletal: Strength & Muscle Tone: within normal limits Gait & Station: normal Patient leans: N/A  Psychiatric Specialty Exam: Review of Systems  There were no vitals taken for this visit.There is no height or weight on file to calculate BMI.  General Appearance: Well Groomed  Eye Contact:  Good  Speech:  Clear and Coherent and Normal Rate  Volume:  Normal  Mood:  Anxious  Affect:  Appropriate and Congruent  Thought Process:  Coherent, Goal Directed, and Linear  Orientation:  Full (Time, Place, and Person)  Thought Content:  WDL  Suicidal Thoughts:  No  Homicidal Thoughts:  No  Memory:  Immediate;   Good Recent;   Good Remote;   Good  Judgement:  Fair  Insight:  Good  Psychomotor Activity:  Restlessness  Concentration:  Concentration: Fair and Attention Span: Fair  Recall:  Good  Fund of Knowledge:Good  Language: Good  Akathisia:  No  Handed:  Right  AIMS (if indicated):  not done  Assets:  Communication Skills Desire for Improvement Financial Resources/Insurance Housing Intimacy Physical Health Social Support Transportation  ADL's:  Intact  Cognition: WNL  Sleep:  Poor   Screenings: Flowsheet Row ED from 06/02/2024 in [facility] Department at [facility] ED from 12/26/2020 in [facility] Department at [facility]  C-SSRS RISK CATEGORY No Risk No Risk    Assessment and Plan: Patient informed writer that he is fearful of benzo withdrawal.  He has been out of Xanax for 1 day.  He describes having a seizure episode, intense nausea, and vomiting which he stopped Xanax prior.  Today patient given Xanax 0.5 mg to be taken twice daily for a week.  Provider instructed patient to taper down to 1 tablet daily as needed for the following weeks prior to discontinuing.  Patient also agreeable to starting propranolol 10 mg 3 times daily to help manage his anxiety.  He was also given mirtazapine 15 mg nightly to help manage sleep, anxiety, depression.  1. Benzodiazepine dependence (HCC) (Primary)  Start- propranolol (INDERAL) 10 MG tablet; Take 1 tablet (10 mg total) by mouth 3 (three) times daily.  Dispense: 90 tablet; Refill: 3 Reduced- ALPRAZolam (XANAX) 0.5 MG tablet; Take 2 tablets (1 mg total) by mouth 2 (two) times daily as needed for anxiety. Take 2 tablet daily as needed for a week and then reduce to 1 tablet daily if needed.  Then discontinue  Dispense: 60 tablet; Refill: 0  2. Substance induced mood disorder (HCC)  Start- mirtazapine (REMERON) 15 MG tablet; Take 1 tablet (15 mg total) by mouth at bedtime.  Dispense: 30 tablet; Refill: 3  3. Marijuana use   4. Generalized anxiety disorder  Reduced- ALPRAZolam (XANAX) 0.5 MG tablet; Take 2 tablets (1 mg total) by mouth 2 (two) times daily as needed for anxiety. Take 2 tablet daily as needed for a week and then reduce to 1 tablet daily if needed.  Then discontinue  Dispense: 60 tablet; Refill: 0 Start- mirtazapine (REMERON) 15 MG tablet; Take 1 tablet (15 mg total) by mouth at bedtime.  Dispense: 30 tablet; Refill: 3     Collaboration of Care: Other provider involved in patient's care AEB follow-up with psychiatric team in [place]  Patient/Guardian was advised Release of Information must be obtained prior to any record release in order to  collaborate their care with an outside provider. Patient/Guardian was advised if they have not already done so to contact the registration department to sign all necessary forms in order for us  to release information regarding their care.   Consent: Patient/Guardian gives verbal consent for treatment and assignment of benefits for services provided during this visit. Patient/Guardian expressed understanding and agreed to proceed.   Follow-up as needed Zane FORBES Bach, NP 11/3/20259:37 AM

## 2024-06-10 NOTE — Progress Notes (Signed)
 Patient contacted the [facility] provider line stating that he was seen on Monday and prescribed mirtazapine. He is requesting mirtazapine be sent to [facility] in [place] due to [facility] pharmacy being closed today. Chart reviewed; pt was seen by Zane Bach, NP on 06/04/24 and mirtazapine 15 mg PO at bedtime was started with 3 refills provided. Call returned to [facility] at 11:26 am and he advises that his mother had already picked up his medication and that he was not aware of this. Pt thanked this provider for returning call and no further concerns were voiced by the patient.   Sherrell Culver, PMHNP-BC, FNP-BC

## 2024-06-13 ENCOUNTER — Other Ambulatory Visit: Payer: Self-pay

## 2024-06-13 ENCOUNTER — Emergency Department (HOSPITAL_COMMUNITY)
Admission: EM | Admit: 2024-06-13 | Discharge: 2024-06-13 | Disposition: A | Discharge status: Home / Self Care | Attending: Emergency Medicine | Admitting: Emergency Medicine

## 2024-06-13 ENCOUNTER — Emergency Department (HOSPITAL_COMMUNITY)

## 2024-06-13 DIAGNOSIS — W01198A Fall on same level from slipping, tripping and stumbling with subsequent striking against other object, initial encounter: Secondary | ICD-10-CM | POA: Insufficient documentation

## 2024-06-13 DIAGNOSIS — R569 Unspecified convulsions: Secondary | ICD-10-CM

## 2024-06-13 DIAGNOSIS — R258 Other abnormal involuntary movements: Secondary | ICD-10-CM | POA: Diagnosis not present

## 2024-06-13 DIAGNOSIS — W19XXXA Unspecified fall, initial encounter: Secondary | ICD-10-CM

## 2024-06-13 DIAGNOSIS — Y990 Civilian activity done for income or pay: Secondary | ICD-10-CM | POA: Diagnosis not present

## 2024-06-13 DIAGNOSIS — R55 Syncope and collapse: Secondary | ICD-10-CM | POA: Diagnosis not present

## 2024-06-13 DIAGNOSIS — F1990 Other psychoactive substance use, unspecified, uncomplicated: Secondary | ICD-10-CM | POA: Diagnosis not present

## 2024-06-13 DIAGNOSIS — S0003XA Contusion of scalp, initial encounter: Secondary | ICD-10-CM | POA: Diagnosis not present

## 2024-06-13 DIAGNOSIS — S0990XA Unspecified injury of head, initial encounter: Secondary | ICD-10-CM | POA: Diagnosis present

## 2024-06-13 DIAGNOSIS — F1320 Sedative, hypnotic or anxiolytic dependence, uncomplicated: Secondary | ICD-10-CM

## 2024-06-13 LAB — COMPREHENSIVE METABOLIC PANEL WITH GFR
ALT: 8 U/L (ref 0–44)
AST: 23 U/L (ref 15–41)
Albumin: 4.6 g/dL (ref 3.5–5.0)
Alkaline Phosphatase: 64 U/L (ref 38–126)
Anion gap: 12 (ref 5–15)
BUN: 17 mg/dL (ref 6–20)
CO2: 27 mmol/L (ref 22–32)
Calcium: 9.3 mg/dL (ref 8.9–10.3)
Chloride: 101 mmol/L (ref 98–111)
Creatinine, Ser: 0.94 mg/dL (ref 0.61–1.24)
GFR, Estimated: >60 mL/min (ref >60)
Glucose, Bld: 111 mg/dL — ABNORMAL HIGH (ref 70–99)
Potassium: 3.7 mmol/L (ref 3.5–5.1)
Sodium: 140 mmol/L (ref 135–145)
Total Bilirubin: 0.4 mg/dL (ref 0.0–1.2)
Total Protein: 7.1 g/dL (ref 6.5–8.1)

## 2024-06-13 LAB — URINE DRUG SCREEN
Amphetamines: NEGATIVE
Barbiturates: NEGATIVE
Benzodiazepines: NEGATIVE
Cocaine: NEGATIVE
Fentanyl: NEGATIVE
Methadone Scn, Ur: NEGATIVE
Opiates: NEGATIVE
Tetrahydrocannabinol: POSITIVE — AB

## 2024-06-13 LAB — CBC
HCT: 45.4 % (ref 39.0–52.0)
Hemoglobin: 14.9 g/dL (ref 13.0–17.0)
MCH: 31 pg (ref 26.0–34.0)
MCHC: 32.8 g/dL (ref 30.0–36.0)
MCV: 94.4 fL (ref 80.0–100.0)
Platelets: 359 K/uL (ref 150–400)
RBC: 4.81 MIL/uL (ref 4.22–5.81)
RDW: 12.1 % (ref 11.5–15.5)
WBC: 10.7 K/uL — ABNORMAL HIGH (ref 4.0–10.5)
nRBC: 0 % (ref 0.0–0.2)

## 2024-06-13 LAB — ETHANOL: Alcohol, Ethyl (B): <15 mg/dL (ref <15)

## 2024-06-13 MED ORDER — LORAZEPAM 2 MG/ML IJ SOLN
1.0000 mg | Freq: Once | INTRAMUSCULAR | Status: AC
  Administered 2024-06-13: 1 mg via INTRAVENOUS
  Filled 2024-06-13: qty 1

## 2024-06-13 MED ORDER — LACTATED RINGERS IV BOLUS
1000.0000 mL | Freq: Once | INTRAVENOUS | Status: AC
  Administered 2024-06-13: 1000 mL via INTRAVENOUS

## 2024-06-13 NOTE — ED Notes (Signed)
 ED Provider at bedside.

## 2024-06-13 NOTE — Discharge Instructions (Addendum)
 It was our pleasure to provide your ER care today - we hope that you feel better. Drink plenty of fluids/stay well hydrated.   Avoid xanax or drug use as it is harmful to your physical health and mental well-being. See resource guide attached in terms of accessing inpatient or outpatient substance use treatment programs.   In terms of slowly tapering off of the xanax - follow the plan recently provided to you by the behavioral health provider, and follow the tapering schedule they provided to you.    Follow up closely with primary care doctor and behavioral health provider in the coming week. Also given possible seizure, follow up closely with neurologist in the next 1-2 weeks - call office to arrange appointment.   No driving, operating heavy machinery, climbing ladders, swimming unsupervised, or other potentially dangerous activity until cleared to do so by your doctor/neurologist - also see attached information re: seizures.   For mental health issues and/or crisis, you may also go directly to the [facility] - they are open 24/7 and walk-ins are welcome.    Return to ER if worse, new symptoms, fevers, chest pain, trouble breathing, new/severe pain, recurrent fainting or recurrent seizure, or other emergency concern.

## 2024-06-13 NOTE — ED Triage Notes (Addendum)
 Patient BIB [facility] from work witnessed falling backwards with arms tensed up. Hitting concrete floor hitting his head. Was unable to answer EMS. No history of seizure. Patient complaint of nausea.

## 2024-06-13 NOTE — ED Provider Notes (Signed)
 [place] EMERGENCY DEPARTMENT AT [facility] Provider Note   CSN: [id number] Arrival date & time: 06/13/24  1520     Patient presents with: Stephen Villegas is a 28 y.o. male.   Patient presents s/p fall, ?syncopal event at work. Was standing, seemed to tense up and fell back, hit head. Brief loc. No generalized tonic clonic seizure activity described per EMS report. Patient did come to and was briefly confused, disoriented.  On arrival to ED is alert and oriented. Patient noted mild nausea earlier, not currently. Denies hx seizure. No incontinence. No oral or tongue injury.  Denies hx similar episodes, no hx seizures, no hx syncope. Denies headache. No neck or back pain. No chest pain or sob. No palpitations. No abd pain or vomiting/diarrhea. No rectal bleeding, melena, or any form of blood loss. No gu c/o. No extremity pain or swelling.  Pt did not volunteer any change in meds, new meds, etc, but from chart review, noted to have chronic bzd use, and recent visit for tapering slowly off xanax.   The history is provided by the patient, the EMS personnel and medical records.  Fall Pertinent negatives include no chest pain, no abdominal pain, no headaches and no shortness of breath.       Prior to Admission medications   Medication Sig Start Date End Date Taking? Authorizing Provider  ALPRAZolam (XANAX) 0.5 MG tablet Take 2 tablets (1 mg total) by mouth 2 (two) times daily as needed for anxiety. Take 2 tablet daily as needed for a week and then reduce to 1 tablet daily if needed.  Then discontinue 06/04/24  Yes Harl Regan E, NP  buPROPion (WELLBUTRIN XL) 150 MG 24 hr tablet Take 150 mg by mouth daily. 05/06/24  Yes [provider]  ibuprofen (ADVIL) 200 MG tablet Take 200 mg by mouth every 6 (six) hours as needed for mild pain (pain score 1-3).   Yes [provider]  mirtazapine (REMERON) 15 MG tablet Take 1 tablet (15 mg total) by mouth at bedtime.  06/04/24  Yes Harl Regan E, NP  propranolol (INDERAL) 10 MG tablet Take 1 tablet (10 mg total) by mouth 3 (three) times daily. Patient not taking: Reported on 06/13/2024 06/04/24   Harl Regan BRAVO, NP    Allergies: Patient has no known allergies.    Review of Systems  Constitutional:  Negative for chills, diaphoresis and fever.  HENT:  Negative for sore throat and trouble swallowing.   Eyes:  Negative for pain, redness and visual disturbance.  Respiratory:  Negative for cough and shortness of breath.   Cardiovascular:  Negative for chest pain, palpitations and leg swelling.  Gastrointestinal:  Negative for abdominal pain, blood in stool, diarrhea and vomiting.  Genitourinary:  Negative for dysuria, flank pain and hematuria.  Musculoskeletal:  Negative for back pain and neck pain.  Skin:  Negative for rash and wound.  Neurological:  Negative for speech difficulty, weakness, numbness and headaches.    Updated Vital Signs BP 127/72   Pulse 82   Temp 98.2 F (36.8 C) (Oral)   Resp 18   Ht 1.829 m (6')   Wt 68 kg   SpO2 97%   BMI 20.34 kg/m   Physical Exam Vitals and nursing note reviewed.  Constitutional:      Appearance: Normal appearance. He is well-developed.  HENT:     Head:     Comments: Contusion left scalp.     Right Ear:  Tympanic membrane normal.     Left Ear: Tympanic membrane normal.     Nose: Nose normal.     Mouth/Throat:     Mouth: Mucous membranes are moist.     Pharynx: Oropharynx is clear.     Comments: No oral or tongue trauma noted.  Eyes:     General: No scleral icterus.    Extraocular Movements: Extraocular movements intact.     Conjunctiva/sclera: Conjunctivae normal.     Pupils: Pupils are equal, round, and reactive to light.  Neck:     Trachea: No tracheal deviation.     Comments: No bruits. Trachea midline. No neck stiffness or rigidity. Normal rom c spine without pain.  Cardiovascular:     Rate and Rhythm: Normal rate and regular  rhythm.     Pulses: Normal pulses.     Heart sounds: Normal heart sounds. No murmur heard.    No friction rub. No gallop.  Pulmonary:     Effort: Pulmonary effort is normal. No accessory muscle usage or respiratory distress.     Breath sounds: Normal breath sounds.  Chest:     Chest wall: No tenderness.  Abdominal:     General: Bowel sounds are normal. There is no distension.     Palpations: Abdomen is soft. There is no mass.     Tenderness: There is no abdominal tenderness. There is no guarding.  Genitourinary:    Comments: No cva tenderness. Musculoskeletal:        General: No swelling.     Cervical back: Normal range of motion and neck supple. No rigidity or tenderness.     Comments: CTLS spine, non tender, aligned, no step off. Good rom bil extremities without pain or focal bony tenderness.   Skin:    General: Skin is warm and dry.     Findings: No rash.  Neurological:     Mental Status: He is alert.     Comments: Alert, speech clear. Motor/sens grossly intact bil. Stre 5/5 bil. Sens intact. Steady gait.   Psychiatric:        Mood and Affect: Mood normal.     (all labs ordered are listed, but only abnormal results are displayed) Results for orders placed or performed during the hospital encounter of 06/13/24  Comprehensive metabolic panel with GFR   Collection Time: 06/13/24  4:10 PM  Result Value Ref Range   Sodium 140 135 - 145 mmol/L   Potassium 3.7 3.5 - 5.1 mmol/L   Chloride 101 98 - 111 mmol/L   CO2 27 22 - 32 mmol/L   Glucose, Bld 111 (H) 70 - 99 mg/dL   BUN 17 6 - 20 mg/dL   Creatinine, Ser 9.05 0.61 - 1.24 mg/dL   Calcium 9.3 8.9 - 89.6 mg/dL   Total Protein 7.1 6.5 - 8.1 g/dL   Albumin 4.6 3.5 - 5.0 g/dL   AST 23 15 - 41 U/L   ALT 8 0 - 44 U/L   Alkaline Phosphatase 64 38 - 126 U/L   Total Bilirubin 0.4 0.0 - 1.2 mg/dL   GFR, Estimated >39 >39 mL/min   Anion gap 12 5 - 15  CBC   Collection Time: 06/13/24  4:10 PM  Result Value Ref Range   WBC 10.7  (H) 4.0 - 10.5 K/uL   RBC 4.81 4.22 - 5.81 MIL/uL   Hemoglobin 14.9 13.0 - 17.0 g/dL   HCT 54.5 60.9 - 47.9 %   MCV 94.4 80.0 - 100.0 fL  MCH 31.0 26.0 - 34.0 pg   MCHC 32.8 30.0 - 36.0 g/dL   RDW 87.8 88.4 - 84.4 %   Platelets 359 150 - 400 K/uL   nRBC 0.0 0.0 - 0.2 %  Ethanol   Collection Time: 06/13/24  4:10 PM  Result Value Ref Range   Alcohol, Ethyl (B) <15 <15 mg/dL  Urine Drug Screen   Collection Time: 06/13/24  4:34 PM  Result Value Ref Range   Opiates NEGATIVE NEGATIVE   Cocaine NEGATIVE NEGATIVE   Benzodiazepines NEGATIVE NEGATIVE   Amphetamines NEGATIVE NEGATIVE   Tetrahydrocannabinol POSITIVE (A) NEGATIVE   Barbiturates NEGATIVE NEGATIVE   Methadone Scn, Ur NEGATIVE NEGATIVE   Fentanyl NEGATIVE NEGATIVE    EKG: EKG Interpretation Date/Time:  Wednesday June 13 2024 16:38:28 EST Ventricular Rate:  67 PR Interval:  106 QRS Duration:  109 QT Interval:  374 QTC Calculation: 395 R Axis:   61  Text Interpretation: Sinus rhythm Non-specific ST-t changes No previous tracing Confirmed by Bernard Drivers ([id number]) on 06/13/2024 4:49:54 PM  Radiology: CT Head Wo Contrast Result Date: 06/13/2024 EXAM: CT HEAD WITHOUT CONTRAST 06/13/2024 04:04:00 PM TECHNIQUE: CT of the head was performed without the administration of intravenous contrast. Automated exposure control, iterative reconstruction, and/or weight based adjustment of the mA/kV was utilized to reduce the radiation dose to as low as reasonably achievable. COMPARISON: None available. CLINICAL HISTORY: Head trauma, moderate-severe FINDINGS: BRAIN AND VENTRICLES: No acute hemorrhage. No evidence of acute infarct. No hydrocephalus. No extra-axial collection. No mass effect or midline shift. ORBITS: No acute abnormality. SINUSES: No acute abnormality. SOFT TISSUES AND SKULL: Left parietal scalp soft tissue swelling and hematoma. No skull fracture. IMPRESSION: 1. No acute intracranial abnormality. 2. Left parietal scalp soft  tissue swelling and hematoma. 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 Labs: ordered. Decision-making details documented in ED Course. Radiology: ordered and independent interpretation performed. Decision-making details documented in ED Course. ECG/medicine tests: ordered and independent interpretation performed. Decision-making details documented in ED Course.  Risk Prescription drug management. Parenteral controlled substances. Decision regarding hospitalization.   Iv ns. Continuous pulse ox and cardiac monitoring. Labs ordered/sent. Imaging ordered.   Differential diagnosis includes syncope, seizure, etc. Dispo decision including potential need for admission considered - will get labs and imaging and reassess.   Reviewed nursing notes and prior charts for additional history.  External reports reviewed. Additional history  from: EMS.  LR bolus. ?possible sz (vs other cause). Ativan dose given.   Cardiac monitor: sinus rhythm, rate 76.  Labs reviewed/interpreted by me - wbc 10, hgb normal. Chem unremarkable. Uds +THC.   CT reviewed/interpreted by me - no hem.   Recheck spine non tender. Abd soft nt. Pt denies any pain or other current complaint. Discussed note in EPIC 11/3 - indicates he gets xanax/BZD med on street. Discussed dangers of doing so, and need to follow slow taper off plan as outlined by his doctor/[facility] - pt voices understanding.   Recheck, no current symptoms. Po fluids/food, ambulate in hall. No tremor or shakes. No nv. No pain of any sort. Pt indicates feels ready for d/c.   As possible sz, advised no driving until cleared to do so by his doctor/neurology.   Rec close pcp/[facility]/neurology f/u.  Return precautions provided.       Final diagnoses:  Accidental fall, initial encounter  Contusion of scalp, initial encounter  Syncope and collapse  Benzedrine use disorder, moderate (HCC)  Seizure-like activity [facility])    ED Discharge Orders          Ordered    Ambulatory referral to Neurology       Comments: An appointment is requested in approximately: 1 week   06/13/24 DANIAL               Bernard Drivers, MD 06/13/24 1857

## 2024-06-15 ENCOUNTER — Ambulatory Visit: Admitting: Neurology

## 2024-06-15 ENCOUNTER — Encounter: Payer: Self-pay | Admitting: Neurology

## 2024-06-15 VITALS — BP 113/67 | HR 68 | Ht 71.0 in | Wt 149.0 lb

## 2024-06-15 DIAGNOSIS — R569 Unspecified convulsions: Secondary | ICD-10-CM

## 2024-06-15 NOTE — Patient Instructions (Signed)
 Routine EEG, I will contact you to go over the results If normal, we will proceed with a 3-day ambulatory EEG If any abnormality, will get MRI brain and most likely start antiseizure medication Please contact me if you do have another event Continue to follow your PCP Return sooner if worse

## 2024-06-15 NOTE — Progress Notes (Signed)
 [facility]  PATIENT: Stephen Villegas DOB: [birth year]  REQUESTING CLINICIAN: Bernard Drivers, MD HISTORY FROM: Patient and father REASON FOR VISIT: Seizure   HISTORICAL  CHIEF COMPLAINT:  Chief Complaint  Patient presents with   New Patient (Initial Visit)    Rm 13, Father.  Sncope vs Seizures.  Seizure at work yesterday. (New onset)     HISTORY OF PRESENT ILLNESS:  Discussed the use of AI scribe software for clinical note transcription with the patient, who gave verbal consent to proceed.  SABURO LUGER is a 28 year old male with history of anxiety depression who presents with a recent episode of loss of consciousness and suspected seizure.  Two days ago, he experienced a sudden loss of consciousness at work, resulting in a fall and head injury. Witnesses observed shaking during the episode. He has no clear memory of the event or the subsequent hospital visit, recalling only a vague image of a person at the hospital. This is his first episode of this nature, with no prior history of seizures, staring spells, or involuntary movements.  He uses marijuana daily, typically at night, but did not use it on the day of the incident. He denies using other substances such as kratom. His sleep is disrupted due to caring for his one-year-old child, contributing to sleep deprivation. He was taking bupropion daily until a month ago, which he stopped as instructed by his healthcare provider. He is currently on propranolol three times a day to aid in discontinuing bupropion, and takes Remeron for sleep. He has stopped using Xanax.  There is no family history of seizures. He denies recent alcohol use or other potential seizure triggers. He reports soreness at the back of his head from the fall, which he is managing with Aleve. No tongue biting or incontinence occurred during the episode.    Handedness: Right-handed  Onset: November 12  Seizure Type: Stiffness loss of  consciousness falling confusion  Current frequency: Only once  Any injuries from seizures: Hit top of head  Seizure risk factors: Denies  Previous ASMs: None  Currenty ASMs: None  ASMs side effects: Not applicable  Brain Images: No acute intracranial abnormality  Previous EEGs: Not previously done   OTHER MEDICAL CONDITIONS: Anxiety depression  REVIEW OF SYSTEMS: Full 14 system review of systems performed and negative with exception of: As noted in the HPI  ALLERGIES: No Known Allergies  HOME MEDICATIONS: Outpatient Medications Prior to Visit  Medication Sig Dispense Refill   ibuprofen (ADVIL) 200 MG tablet Take 200 mg by mouth every 6 (six) hours as needed for mild pain (pain score 1-3).     mirtazapine (REMERON) 15 MG tablet Take 1 tablet (15 mg total) by mouth at bedtime. 30 tablet 3   ALPRAZolam (XANAX) 0.5 MG tablet Take 2 tablets (1 mg total) by mouth 2 (two) times daily as needed for anxiety. Take 2 tablet daily as needed for a week and then reduce to 1 tablet daily if needed.  Then discontinue (Patient not taking: Reported on 06/15/2024) 60 tablet 0   buPROPion (WELLBUTRIN XL) 150 MG 24 hr tablet Take 150 mg by mouth daily. (Patient not taking: Reported on 06/15/2024)     propranolol (INDERAL) 10 MG tablet Take 1 tablet (10 mg total) by mouth 3 (three) times daily. (Patient not taking: Reported on 06/15/2024) 90 tablet 3   No facility-administered medications prior to visit.    PAST MEDICAL HISTORY: History reviewed. No pertinent past medical history.  PAST SURGICAL HISTORY: History reviewed. No pertinent surgical history.  FAMILY HISTORY: Family History  Problem Relation Age of Onset   Healthy Mother    Heart attack Father    Seizures Neg Hx     SOCIAL HISTORY: Social History   Socioeconomic History   Marital status: Single    Spouse name: Not on file   Number of children: Not on file   Years of education: Not on file   Highest education level: Not  on file  Occupational History   Not on file  Tobacco Use   Smoking status: Every Day    Types: E-cigarettes   Smokeless tobacco: Never  Vaping Use   Vaping status: Every Day  Substance and Sexual Activity   Alcohol use: Yes    Comment: occasionally   Drug use: Yes    Types: Marijuana    Comment: daily   Sexual activity: Yes  Other Topics Concern   Not on file  Social History Narrative   ** Merged History Encounter **    Caffiene 3-4 drinks daily   Working: [employer] alone, pets: 2 dogs   Social Drivers of Corporate Investment Banker Strain: Not on file  Food Insecurity: Not on file  Transportation Needs: Not on file  Physical Activity: Not on file  Stress: Not on file  Social Connections: Not on file  Intimate Partner Violence: Not on file    PHYSICAL EXAM  GENERAL EXAM/CONSTITUTIONAL: Vitals:  Vitals:   06/15/24 0957  BP: 113/67  Pulse: 68  Weight: 149 lb (67.6 kg)  Height: 5' 11 (1.803 m)   Body mass index is 20.78 kg/m. Wt Readings from Last 3 Encounters:  06/15/24 149 lb (67.6 kg)  06/13/24 150 lb (68 kg)  06/02/24 150 lb (68 kg)   Patient is in no distress; well developed, nourished and groomed; neck is supple  MUSCULOSKELETAL: Gait, strength, tone, movements noted in Neurologic exam below  NEUROLOGIC: MENTAL STATUS:      No data to display         awake, alert, oriented to person, place and time recent and remote memory intact normal attention and concentration language fluent, comprehension intact, naming intact fund of knowledge appropriate  CRANIAL NERVE:  2nd, 3rd, 4th, 6th - Visual fields full to confrontation, extraocular muscles intact, no nystagmus 5th - facial sensation symmetric 7th - facial strength symmetric 8th - hearing intact 9th - palate elevates symmetrically, uvula midline 11th - shoulder shrug symmetric 12th - tongue protrusion midline  MOTOR:  normal bulk and tone, full strength in the BUE,  BLE  SENSORY:  normal and symmetric to light touch  COORDINATION:  finger-nose-finger, fine finger movements normal  GAIT/STATION:  normal    DIAGNOSTIC DATA (LABS, IMAGING, TESTING) - I reviewed patient records, labs, notes, testing and imaging myself where available.  Lab Results  Component Value Date   WBC 10.7 (H) 06/13/2024   HGB 14.9 06/13/2024   HCT 45.4 06/13/2024   MCV 94.4 06/13/2024   PLT 359 06/13/2024      Component Value Date/Time   NA 140 06/13/2024 1610   K 3.7 06/13/2024 1610   CL 101 06/13/2024 1610   CO2 27 06/13/2024 1610   GLUCOSE 111 (H) 06/13/2024 1610   BUN 17 06/13/2024 1610   CREATININE 0.94 06/13/2024 1610   CALCIUM 9.3 06/13/2024 1610   PROT 7.1 06/13/2024 1610   ALBUMIN 4.6 06/13/2024 1610   AST 23 06/13/2024 1610  ALT 8 06/13/2024 1610   ALKPHOS 64 06/13/2024 1610   BILITOT 0.4 06/13/2024 1610   GFRNONAA >60 06/13/2024 1610   No results found for: CHOL, HDL, LDLCALC, LDLDIRECT, TRIG No results found for: HGBA1C No results found for: VITAMINB12 No results found for: TSH  Head CT 06/13/2024 1. No acute intracranial abnormality. 2. Left parietal scalp soft tissue swelling and hematoma.    ASSESSMENT AND PLAN  28 y.o. year old male  with    First unprovoked seizure Occurred two days ago with loss of consciousness, shaking, and postictal confusion. No prior seizure history. Possible triggers include marijuana use. Differential diagnosis includes provoked seizures due to substance use or sleep deprivation, and unprovoked seizures due to structural brain abnormalities, vascular malformations, or old head trauma or syncope. EEG and MRI are planned to assess for structural abnormalities and determine seizure type. - Scheduled EEG to assess brainwave activity. - Will consider home EEG for extended monitoring if initial EEG is normal. - Will order brain MRI if EEG shows abnormalities. - Advised against driving for six  months post-seizure as per state law. - Discussed potential need for seizure medication if EEG or MRI indicates seizure disorder.  Headache after head trauma Headache following head trauma from fall during seizure. No significant physical injury, but soreness at the back of the head. - Continue Aleve for headache management.  Back pain after fall Back pain following fall during seizure. No significant injury reported, but discomfort persists. - Continue Aleve for pain management.    1. Seizures (HCC)     Patient Instructions  Routine EEG, I will contact you to go over the results If normal, we will proceed with a 3-day ambulatory EEG If any abnormality, will get MRI brain and most likely start antiseizure medication Please contact me if you do have another event Continue to follow your PCP Return sooner if worse   Per [place] statutes, patients with seizures are not allowed to drive until they have been seizure-free for six months.  Other recommendations include using caution when using heavy equipment or power tools. Avoid working on ladders or at heights. Take showers instead of baths.  Do not swim alone.  Ensure the water temperature is not too high on the home water heater. Do not go swimming alone. Do not lock yourself in a room alone (i.e. bathroom). When caring for infants or small children, sit down when holding, feeding, or changing them to minimize risk of injury to the child in the event you have a seizure. Maintain good sleep hygiene. Avoid alcohol.  Also recommend adequate sleep, hydration, good diet and minimize stress.   During the Seizure  - First, ensure adequate ventilation and place patients on the floor on their left side  Loosen clothing around the neck and ensure the airway is patent. If the patient is clenching the teeth, do not force the mouth open with any object as this can cause severe damage - Remove all items from the surrounding that can be  hazardous. The patient may be oblivious to what's happening and may not even know what he or she is doing. If the patient is confused and wandering, either gently guide him/her away and block access to outside areas - Reassure the individual and be comforting - Call 911. In most cases, the seizure ends before EMS arrives. However, there are cases when seizures may last over 3 to 5 minutes. Or the individual may have developed breathing difficulties or severe  injuries. If a pregnant patient or a person with diabetes develops a seizure, it is prudent to call an ambulance. - Finally, if the patient does not regain full consciousness, then call EMS. Most patients will remain confused for about 45 to 90 minutes after a seizure, so you must use judgment in calling for help. - Avoid restraints but make sure the patient is in a bed with padded side rails - Place the individual in a lateral position with the neck slightly flexed; this will help the saliva drain from the mouth and prevent the tongue from falling backward - Remove all nearby furniture and other hazards from the area - Provide verbal assurance as the individual is regaining consciousness - Provide the patient with privacy if possible - Call for help and start treatment as ordered by the caregiver   After the Seizure (Postictal Stage)  After a seizure, most patients experience confusion, fatigue, muscle pain and/or a headache. Thus, one should permit the individual to sleep. For the next few days, reassurance is essential. Being calm and helping reorient the person is also of importance.  Most seizures are painless and end spontaneously. Seizures are not harmful to others but can lead to complications such as stress on the lungs, brain and the heart. Individuals with prior lung problems may develop labored breathing and respiratory distress.    Discussed Patients with epilepsy have a small risk of sudden unexpected death, a condition referred  to as sudden unexpected death in epilepsy (SUDEP). SUDEP is defined specifically as the sudden, unexpected, witnessed or unwitnessed, nontraumatic and nondrowning death in patients with epilepsy with or without evidence for a seizure, and excluding documented status epilepticus, in which post mortem examination does not reveal a structural or toxicologic cause for death     Orders Placed This Encounter  Procedures   EEG adult    No orders of the defined types were placed in this encounter.   Return if symptoms worsen or fail to improve.    Pastor Falling, MD 06/15/2024, 10:42 AM  [facility] [address] [place], [place] [address] [phone]

## 2024-06-26 ENCOUNTER — Ambulatory Visit: Payer: Self-pay | Admitting: Neurology

## 2024-06-26 ENCOUNTER — Ambulatory Visit: Admitting: Neurology

## 2024-06-26 DIAGNOSIS — R569 Unspecified convulsions: Secondary | ICD-10-CM

## 2024-06-26 NOTE — Procedures (Signed)
    History:  28 year old man with seizure.   EEG classification: Awake and drowsy  Duration: 26 minutes   Technical aspects: This EEG study was done with scalp electrodes positioned according to the 10-20 International system of electrode placement. Electrical activity was reviewed with band pass filter of 1-70Hz , sensitivity of 7 uV/mm, display speed of 67mm/sec with a 60Hz  notched filter applied as appropriate. EEG data were recorded continuously and digitally stored.   Description of the recording: The background rhythms of this recording consists of a fairly well modulated medium amplitude alpha rhythm of 9 Hz that is reactive to eye opening and closure. Present in the anterior head region is a 15-20 Hz beta activity. Photic stimulation was performed, did not show any abnormalities. Hyperventilation was also performed, did not show any abnormalities. Drowsiness was manifested by background fragmentation. No abnormal epileptiform discharges seen during this recording. There was no focal slowing. There were no electrographic seizure identified.   Abnormality: None   Impression: This is a normal awake and drowsy EEG. No evidence of interictal epileptiform discharges. Normal EEGs, however, do not rule out epilepsy.    Linzie Boursiquot, MD [facility]

## 2024-06-27 ENCOUNTER — Telehealth: Payer: Self-pay

## 2024-06-27 NOTE — Telephone Encounter (Signed)
  Eeg order emailed to [facility]

## 2024-07-17 ENCOUNTER — Telehealth (HOSPITAL_COMMUNITY): Payer: Self-pay

## 2024-07-17 ENCOUNTER — Telehealth (HOSPITAL_COMMUNITY): Payer: Self-pay | Admitting: Psychiatry

## 2024-07-17 NOTE — Telephone Encounter (Signed)
 Reached out to the [place] office to get PT scheduled - Joliza called PT and PT declined the appointment.

## 2024-07-17 NOTE — Telephone Encounter (Signed)
 PT came in this morning at 1045 stating that [facility] sent him here, tried explaining to the PT that he needed to contact our [place] location PT became upset and started getting angry - I explained to the PT that yes we did see him as a walk in in November but was told that he needed to follow up at our [place] location PT became upset and started to get louder with his voice Luke jumped in and explained to the PT that [facility] might be confused regarding our services here. PT thanked kim and left the front desk
